# Patient Record
Sex: Male | Born: 1950 | Race: Black or African American | Hispanic: No | Marital: Married | Smoking: Never smoker
Health system: Southern US, Community
[De-identification: ages and names within clinical notes are randomized; demographics above are authoritative.]

## PROBLEM LIST (undated history)

## (undated) DIAGNOSIS — M1990 Unspecified osteoarthritis, unspecified site: Secondary | ICD-10-CM

## (undated) DIAGNOSIS — I10 Essential (primary) hypertension: Secondary | ICD-10-CM

## (undated) DIAGNOSIS — J45909 Unspecified asthma, uncomplicated: Secondary | ICD-10-CM

## (undated) DIAGNOSIS — E119 Type 2 diabetes mellitus without complications: Secondary | ICD-10-CM

## (undated) DIAGNOSIS — K219 Gastro-esophageal reflux disease without esophagitis: Secondary | ICD-10-CM

## (undated) HISTORY — DX: Type 2 diabetes mellitus without complications: E11.9

## (undated) HISTORY — DX: Essential (primary) hypertension: I10

---

## 2000-07-22 HISTORY — PX: COLONOSCOPY: SHX174

## 2001-05-05 ENCOUNTER — Ambulatory Visit (HOSPITAL_COMMUNITY): Admission: RE | Admit: 2001-05-05 | Discharge: 2001-05-05 | Payer: Self-pay | Admitting: Internal Medicine

## 2002-09-16 ENCOUNTER — Ambulatory Visit (HOSPITAL_COMMUNITY): Admission: RE | Admit: 2002-09-16 | Discharge: 2002-09-16 | Payer: Self-pay | Admitting: Internal Medicine

## 2002-09-16 ENCOUNTER — Encounter: Payer: Self-pay | Admitting: Internal Medicine

## 2006-04-23 ENCOUNTER — Ambulatory Visit: Payer: Self-pay | Admitting: Orthopedic Surgery

## 2006-08-11 ENCOUNTER — Ambulatory Visit: Payer: Self-pay | Admitting: Orthopedic Surgery

## 2006-08-18 ENCOUNTER — Ambulatory Visit (HOSPITAL_COMMUNITY): Admission: RE | Admit: 2006-08-18 | Discharge: 2006-08-18 | Payer: Self-pay | Admitting: Orthopedic Surgery

## 2006-09-29 ENCOUNTER — Ambulatory Visit: Payer: Self-pay | Admitting: Orthopedic Surgery

## 2008-11-15 DIAGNOSIS — Z8679 Personal history of other diseases of the circulatory system: Secondary | ICD-10-CM | POA: Insufficient documentation

## 2008-11-15 DIAGNOSIS — E119 Type 2 diabetes mellitus without complications: Secondary | ICD-10-CM | POA: Insufficient documentation

## 2008-11-17 ENCOUNTER — Ambulatory Visit: Payer: Self-pay | Admitting: Orthopedic Surgery

## 2008-11-17 DIAGNOSIS — IMO0002 Reserved for concepts with insufficient information to code with codable children: Secondary | ICD-10-CM | POA: Insufficient documentation

## 2008-11-17 DIAGNOSIS — M1710 Unilateral primary osteoarthritis, unspecified knee: Secondary | ICD-10-CM

## 2008-11-17 DIAGNOSIS — M25569 Pain in unspecified knee: Secondary | ICD-10-CM | POA: Insufficient documentation

## 2009-01-09 ENCOUNTER — Ambulatory Visit: Payer: Self-pay | Admitting: Cardiology

## 2009-01-10 ENCOUNTER — Ambulatory Visit (HOSPITAL_COMMUNITY): Admission: RE | Admit: 2009-01-10 | Discharge: 2009-01-10 | Payer: Self-pay | Admitting: Internal Medicine

## 2009-01-10 ENCOUNTER — Encounter (INDEPENDENT_AMBULATORY_CARE_PROVIDER_SITE_OTHER): Payer: Self-pay | Admitting: Internal Medicine

## 2009-03-01 ENCOUNTER — Encounter (INDEPENDENT_AMBULATORY_CARE_PROVIDER_SITE_OTHER): Payer: Self-pay | Admitting: *Deleted

## 2011-08-08 ENCOUNTER — Other Ambulatory Visit: Payer: Self-pay | Admitting: Urology

## 2011-12-27 ENCOUNTER — Ambulatory Visit (INDEPENDENT_AMBULATORY_CARE_PROVIDER_SITE_OTHER): Payer: BC Managed Care – PPO | Admitting: Urology

## 2011-12-27 DIAGNOSIS — N411 Chronic prostatitis: Secondary | ICD-10-CM

## 2011-12-27 DIAGNOSIS — N401 Enlarged prostate with lower urinary tract symptoms: Secondary | ICD-10-CM

## 2011-12-27 DIAGNOSIS — N138 Other obstructive and reflux uropathy: Secondary | ICD-10-CM

## 2011-12-27 DIAGNOSIS — N529 Male erectile dysfunction, unspecified: Secondary | ICD-10-CM

## 2012-04-17 ENCOUNTER — Ambulatory Visit (INDEPENDENT_AMBULATORY_CARE_PROVIDER_SITE_OTHER): Payer: BC Managed Care – PPO | Admitting: Urology

## 2012-04-17 DIAGNOSIS — N411 Chronic prostatitis: Secondary | ICD-10-CM

## 2012-04-17 DIAGNOSIS — N529 Male erectile dysfunction, unspecified: Secondary | ICD-10-CM

## 2012-04-17 DIAGNOSIS — N138 Other obstructive and reflux uropathy: Secondary | ICD-10-CM

## 2012-04-17 DIAGNOSIS — R972 Elevated prostate specific antigen [PSA]: Secondary | ICD-10-CM

## 2012-04-17 DIAGNOSIS — N401 Enlarged prostate with lower urinary tract symptoms: Secondary | ICD-10-CM

## 2012-07-17 ENCOUNTER — Ambulatory Visit (INDEPENDENT_AMBULATORY_CARE_PROVIDER_SITE_OTHER): Payer: BC Managed Care – PPO | Admitting: Urology

## 2012-07-17 DIAGNOSIS — N138 Other obstructive and reflux uropathy: Secondary | ICD-10-CM

## 2012-07-17 DIAGNOSIS — N411 Chronic prostatitis: Secondary | ICD-10-CM

## 2012-07-17 DIAGNOSIS — R972 Elevated prostate specific antigen [PSA]: Secondary | ICD-10-CM

## 2012-07-17 DIAGNOSIS — N401 Enlarged prostate with lower urinary tract symptoms: Secondary | ICD-10-CM

## 2012-07-17 DIAGNOSIS — N529 Male erectile dysfunction, unspecified: Secondary | ICD-10-CM

## 2012-11-30 ENCOUNTER — Other Ambulatory Visit (HOSPITAL_COMMUNITY): Payer: Self-pay | Admitting: Internal Medicine

## 2012-11-30 DIAGNOSIS — R109 Unspecified abdominal pain: Secondary | ICD-10-CM

## 2012-12-01 ENCOUNTER — Ambulatory Visit (HOSPITAL_COMMUNITY)
Admission: RE | Admit: 2012-12-01 | Discharge: 2012-12-01 | Disposition: A | Discharge status: Home / Self Care | Payer: BC Managed Care – PPO | Source: Ambulatory Visit | Attending: Internal Medicine | Admitting: Internal Medicine

## 2012-12-01 DIAGNOSIS — K769 Liver disease, unspecified: Secondary | ICD-10-CM | POA: Insufficient documentation

## 2012-12-01 DIAGNOSIS — R109 Unspecified abdominal pain: Secondary | ICD-10-CM | POA: Insufficient documentation

## 2013-01-01 ENCOUNTER — Ambulatory Visit (INDEPENDENT_AMBULATORY_CARE_PROVIDER_SITE_OTHER): Payer: BC Managed Care – PPO | Admitting: Urology

## 2013-01-01 DIAGNOSIS — N411 Chronic prostatitis: Secondary | ICD-10-CM

## 2013-01-01 DIAGNOSIS — N529 Male erectile dysfunction, unspecified: Secondary | ICD-10-CM

## 2013-01-15 ENCOUNTER — Ambulatory Visit: Payer: BC Managed Care – PPO | Admitting: Urology

## 2013-01-19 ENCOUNTER — Ambulatory Visit (INDEPENDENT_AMBULATORY_CARE_PROVIDER_SITE_OTHER): Payer: BC Managed Care – PPO | Admitting: Gastroenterology

## 2013-01-19 ENCOUNTER — Other Ambulatory Visit: Payer: Self-pay | Admitting: Internal Medicine

## 2013-01-19 ENCOUNTER — Encounter: Payer: Self-pay | Admitting: Gastroenterology

## 2013-01-19 VITALS — BP 132/69 | HR 72 | Temp 98.0°F | Ht 71.0 in | Wt 251.0 lb

## 2013-01-19 DIAGNOSIS — R1013 Epigastric pain: Secondary | ICD-10-CM

## 2013-01-19 DIAGNOSIS — R140 Abdominal distension (gaseous): Secondary | ICD-10-CM

## 2013-01-19 DIAGNOSIS — Z1211 Encounter for screening for malignant neoplasm of colon: Secondary | ICD-10-CM | POA: Insufficient documentation

## 2013-01-19 DIAGNOSIS — R6881 Early satiety: Secondary | ICD-10-CM

## 2013-01-19 DIAGNOSIS — R141 Gas pain: Secondary | ICD-10-CM

## 2013-01-19 MED ORDER — DEXLANSOPRAZOLE 60 MG PO CPDR: 60.0000 mg | DELAYED_RELEASE_CAPSULE | Freq: Every day | ORAL | Status: DC

## 2013-01-19 MED ORDER — PEG 3350-KCL-NA BICARB-NACL 420 G PO SOLR: 4000.0000 mL | ORAL | Status: DC

## 2013-01-19 NOTE — Assessment & Plan Note (Signed)
Overdue for screening colonoscopy. Patient would like to proceed at this time.  I have discussed the risks, alternatives, benefits with regards to but not limited to the risk of reaction to medication, bleeding, infection, perforation and the patient is agreeable to proceed. Written consent to be obtained.

## 2013-01-19 NOTE — Progress Notes (Signed)
Cc PCP 

## 2013-01-19 NOTE — Progress Notes (Signed)
Primary Care Physician:  FANTA,TESFAYE, MD  Primary Gastroenterologist:  Michael Rourk, MD   Chief Complaint  Patient presents with  . Abdominal Pain  . Diarrhea    HPI:  Benjamin Flynn is a 62 y.o. male here at the request of Dr. Fanta for further evaluation of abdominal pain. He had abdominal ultrasound last month which showed fatty liver but otherwise unremarkable. Gallbladder was unremarkable. Four to six weeks of abdominal bloating, rumbling, abdominal pain, early satiety. Feels like he is going to vomit when he eats. Has tried Pepto-Bismol and Gas-X, little benefit. Worse with meals.Typical pattern, one BM daily. For few weeks, may have extra BM. No frank diarrhea. No ill contacts, recent medication changes other than increase in insulin. No brbpr. Black stool with Pepto. No heartburn, indigestion. On Cipro for prostate issues, started after the symptoms started begun. Denies weight loss. States he had a colonoscopy remotely without any issues. Looks like was done by Dr. Rourk, records not available at this time. No prior upper endoscopy.  Current Outpatient Prescriptions  Medication Sig Dispense Refill  . amLODipine (NORVASC) 5 MG tablet Take 5 mg by mouth daily.      . ciprofloxacin (CIPRO) 500 MG tablet Take 500 mg by mouth 2 (two) times daily.      . insulin lispro protamine-lispro (HUMALOG 75/25) (75-25) 100 UNIT/ML SUSP Inject 26 Units into the skin 2 (two) times daily with a meal.      . losartan (COZAAR) 25 MG tablet Take 25 mg by mouth daily. Patient unsure of dose.      . metFORMIN (GLUCOPHAGE) 500 MG tablet Take 500 mg by mouth 2 (two) times daily with a meal.      . simethicone (MYLICON) 125 MG chewable tablet Chew 125 mg by mouth every 6 (six) hours as needed for flatulence.      . tadalafil (CIALIS) 5 MG tablet Take 5 mg by mouth daily as needed for erectile dysfunction.      .         No current facility-administered medications for this visit.    Allergies as of  01/19/2013  . (No Known Allergies)    Past Medical History  Diagnosis Date  . HTN (hypertension)   . Diabetes     Past Surgical History  Procedure Laterality Date  . Colonoscopy  2002    Family History  Problem Relation Age of Onset  . Liver cancer Father   . Colon cancer Neg Hx     History   Social History  . Marital Status: Single    Spouse Name: N/A    Number of Children: 2  . Years of Education: N/A   Occupational History  .     Social History Main Topics  . Smoking status: Never Smoker   . Smokeless tobacco: Not on file  . Alcohol Use: Yes     Comment: former user  . Drug Use: No  . Sexually Active: Not on file   Other Topics Concern  . Not on file   Social History Narrative  . No narrative on file      ROS:  General: Negative for anorexia, weight loss, fever, chills, fatigue, weakness. Eyes: Negative for vision changes.  ENT: Negative for hoarseness, difficulty swallowing , nasal congestion. CV: Negative for chest pain, angina, palpitations, dyspnea on exertion, peripheral edema.  Respiratory: Negative for dyspnea at rest, dyspnea on exertion, cough, sputum, wheezing.  GI: See history of present illness. GU:  Negative for   dysuria, hematuria, urinary incontinence, urinary frequency, nocturnal urination.  MS: Negative for joint pain, low back pain.  Derm: Negative for rash or itching.  Neuro: Negative for weakness, abnormal sensation, seizure, frequent headaches, memory loss, confusion.  Psych: Negative for anxiety, depression, suicidal ideation, hallucinations.  Endo: Negative for unusual weight change.  Heme: Negative for bruising or bleeding. Allergy: Negative for rash or hives.    Physical Examination:  BP 132/69  Pulse 72  Temp(Src) 98 F (36.7 C) (Oral)  Ht 5' 11" (1.803 m)  Wt 251 lb (113.853 kg)  BMI 35.02 kg/m2   General: Well-nourished, well-developed in no acute distress.  Head: Normocephalic, atraumatic.   Eyes: Conjunctiva  pink, no icterus. Mouth: Oropharyngeal mucosa moist and pink , no lesions erythema or exudate. Neck: Supple without thyromegaly, masses, or lymphadenopathy.  Lungs: Clear to auscultation bilaterally.  Heart: Regular rate and rhythm, no murmurs rubs or gallops.  Abdomen: Bowel sounds are normal, mild to moderate epigastric tenderness, nondistended, no hepatosplenomegaly or masses, no abdominal bruits or    hernia , no rebound or guarding.   Rectal: Deferred Extremities: No lower extremity edema. No clubbing or deformities.  Neuro: Alert and oriented x 4 , grossly normal neurologically.  Skin: Warm and dry, no rash or jaundice.   Psych: Alert and cooperative, normal mood and affect.   

## 2013-01-19 NOTE — Patient Instructions (Addendum)
1. Begin Dexilant 60mg  daily before breakfast. Prescription sent to your pharmacy.  2. We have scheduled you for a colonoscopy and upper endoscopy with Dr. Jena Gauss. Please see separate instructions.  3. Please have your blood work done.

## 2013-01-19 NOTE — Assessment & Plan Note (Signed)
62 year old gentleman with for six-week history of epigastric pain, early satiety, nausea, bloating. Abdominal ultrasound showed fatty liver but otherwise unremarkable. Denies heartburn symptoms. Symptoms may be secondary to diabetic gastroparesis however given new onset dyspepsia, EGD is advised.  I have discussed the risks, alternatives, benefits with regards to but not limited to the risk of reaction to medication, bleeding, infection, perforation and the patient is agreeable to proceed. Written consent to be obtained.  1. Dexilant 60mg  daily. 2. CMET, CBC, lipase. 3. EGD.

## 2013-01-27 ENCOUNTER — Encounter: Payer: Self-pay | Admitting: Gastroenterology

## 2013-01-27 ENCOUNTER — Encounter (HOSPITAL_COMMUNITY): Payer: Self-pay | Admitting: Pharmacy Technician

## 2013-02-09 LAB — CBC WITH DIFFERENTIAL/PLATELET
Hemoglobin: 13.7 g/dL (ref 13.0–17.0)
Lymphs Abs: 1.6 10*3/uL (ref 0.7–4.0)
MCH: 28.5 pg (ref 26.0–34.0)
Monocytes Relative: 9 % (ref 3–12)
Neutro Abs: 1.8 10*3/uL (ref 1.7–7.7)
Neutrophils Relative %: 47 % (ref 43–77)
RBC: 4.8 MIL/uL (ref 4.22–5.81)

## 2013-02-09 LAB — COMPREHENSIVE METABOLIC PANEL
ALT: 13 U/L (ref 0–53)
AST: 13 U/L (ref 0–37)
Alkaline Phosphatase: 66 U/L (ref 39–117)
BUN: 13 mg/dL (ref 6–23)
CO2: 29 mEq/L (ref 19–32)
Calcium: 9.4 mg/dL (ref 8.4–10.5)
Chloride: 107 mEq/L (ref 96–112)
Creat: 0.94 mg/dL (ref 0.50–1.35)
Potassium: 3.7 mEq/L (ref 3.5–5.3)
Sodium: 143 mEq/L (ref 135–145)
Total Bilirubin: 0.6 mg/dL (ref 0.3–1.2)

## 2013-02-10 ENCOUNTER — Encounter (HOSPITAL_COMMUNITY)
Admission: RE | Disposition: A | Discharge status: Home / Self Care | Payer: Self-pay | Source: Ambulatory Visit | Attending: Internal Medicine

## 2013-02-10 ENCOUNTER — Encounter (HOSPITAL_COMMUNITY): Payer: Self-pay

## 2013-02-10 ENCOUNTER — Ambulatory Visit (HOSPITAL_COMMUNITY)
Admission: RE | Admit: 2013-02-10 | Discharge: 2013-02-10 | Disposition: A | Discharge status: Home / Self Care | Payer: BC Managed Care – PPO | Source: Ambulatory Visit | Attending: Internal Medicine | Admitting: Internal Medicine

## 2013-02-10 DIAGNOSIS — E119 Type 2 diabetes mellitus without complications: Secondary | ICD-10-CM | POA: Insufficient documentation

## 2013-02-10 DIAGNOSIS — K449 Diaphragmatic hernia without obstruction or gangrene: Secondary | ICD-10-CM | POA: Insufficient documentation

## 2013-02-10 DIAGNOSIS — Z1211 Encounter for screening for malignant neoplasm of colon: Secondary | ICD-10-CM

## 2013-02-10 DIAGNOSIS — K2940 Chronic atrophic gastritis without bleeding: Secondary | ICD-10-CM | POA: Insufficient documentation

## 2013-02-10 DIAGNOSIS — R933 Abnormal findings on diagnostic imaging of other parts of digestive tract: Secondary | ICD-10-CM

## 2013-02-10 DIAGNOSIS — Z01812 Encounter for preprocedural laboratory examination: Secondary | ICD-10-CM | POA: Insufficient documentation

## 2013-02-10 DIAGNOSIS — R6881 Early satiety: Secondary | ICD-10-CM

## 2013-02-10 DIAGNOSIS — R1013 Epigastric pain: Secondary | ICD-10-CM

## 2013-02-10 DIAGNOSIS — K3189 Other diseases of stomach and duodenum: Secondary | ICD-10-CM | POA: Insufficient documentation

## 2013-02-10 DIAGNOSIS — I10 Essential (primary) hypertension: Secondary | ICD-10-CM | POA: Insufficient documentation

## 2013-02-10 DIAGNOSIS — D126 Benign neoplasm of colon, unspecified: Secondary | ICD-10-CM

## 2013-02-10 DIAGNOSIS — R140 Abdominal distension (gaseous): Secondary | ICD-10-CM

## 2013-02-10 DIAGNOSIS — A048 Other specified bacterial intestinal infections: Secondary | ICD-10-CM | POA: Insufficient documentation

## 2013-02-10 DIAGNOSIS — Z794 Long term (current) use of insulin: Secondary | ICD-10-CM | POA: Insufficient documentation

## 2013-02-10 HISTORY — DX: Gastro-esophageal reflux disease without esophagitis: K21.9

## 2013-02-10 HISTORY — DX: Unspecified osteoarthritis, unspecified site: M19.90

## 2013-02-10 HISTORY — PX: COLONOSCOPY WITH ESOPHAGOGASTRODUODENOSCOPY (EGD): SHX5779

## 2013-02-10 LAB — GLUCOSE, CAPILLARY

## 2013-02-10 SURGERY — COLONOSCOPY WITH ESOPHAGOGASTRODUODENOSCOPY (EGD)
Anesthesia: Moderate Sedation

## 2013-02-10 MED ORDER — BUTAMBEN-TETRACAINE-BENZOCAINE 2-2-14 % EX AERO
INHALATION_SPRAY | CUTANEOUS | Status: DC | PRN
  Administered 2013-02-10: 2 via TOPICAL

## 2013-02-10 MED ORDER — ONDANSETRON HCL 4 MG/2ML IJ SOLN
INTRAMUSCULAR | Status: AC
  Filled 2013-02-10: qty 2

## 2013-02-10 MED ORDER — MEPERIDINE HCL 100 MG/ML IJ SOLN
INTRAMUSCULAR | Status: AC
  Filled 2013-02-10: qty 1

## 2013-02-10 MED ORDER — MIDAZOLAM HCL 5 MG/5ML IJ SOLN
INTRAMUSCULAR | Status: DC | PRN
  Administered 2013-02-10 (×2): 1 mg via INTRAVENOUS
  Administered 2013-02-10: 2 mg via INTRAVENOUS
  Administered 2013-02-10: 1 mg via INTRAVENOUS
  Administered 2013-02-10: 2 mg via INTRAVENOUS
  Administered 2013-02-10: 1 mg via INTRAVENOUS

## 2013-02-10 MED ORDER — MEPERIDINE HCL 100 MG/ML IJ SOLN
INTRAMUSCULAR | Status: DC | PRN
  Administered 2013-02-10: 25 mg via INTRAVENOUS
  Administered 2013-02-10: 50 mg via INTRAVENOUS
  Administered 2013-02-10: 25 mg via INTRAVENOUS

## 2013-02-10 MED ORDER — STERILE WATER FOR IRRIGATION IR SOLN
Status: DC | PRN
  Administered 2013-02-10: 09:00:00

## 2013-02-10 MED ORDER — MIDAZOLAM HCL 5 MG/5ML IJ SOLN
INTRAMUSCULAR | Status: AC
  Filled 2013-02-10: qty 10

## 2013-02-10 MED ORDER — SODIUM CHLORIDE 0.9 % IV SOLN
INTRAVENOUS | Status: DC
  Administered 2013-02-10: 08:00:00 via INTRAVENOUS

## 2013-02-10 MED ORDER — ONDANSETRON HCL 4 MG/2ML IJ SOLN
INTRAMUSCULAR | Status: DC | PRN
  Administered 2013-02-10: 4 mg via INTRAVENOUS

## 2013-02-10 NOTE — H&P (View-Only) (Signed)
Primary Care Physician:  Avon Gully, MD  Primary Gastroenterologist:  Roetta Sessions, MD   Chief Complaint  Patient presents with  . Abdominal Pain  . Diarrhea    HPI:  Benjamin Flynn is a 62 y.o. male here at the request of Dr. Felecia Shelling for further evaluation of abdominal pain. He had abdominal ultrasound last month which showed fatty liver but otherwise unremarkable. Gallbladder was unremarkable. Four to six weeks of abdominal bloating, rumbling, abdominal pain, early satiety. Feels like he is going to vomit when he eats. Has tried Pepto-Bismol and Gas-X, little benefit. Worse with meals.Typical pattern, one BM daily. For few weeks, may have extra BM. No frank diarrhea. No ill contacts, recent medication changes other than increase in insulin. No brbpr. Black stool with Pepto. No heartburn, indigestion. On Cipro for prostate issues, started after the symptoms started begun. Denies weight loss. States he had a colonoscopy remotely without any issues. Looks like was done by Dr. Jena Gauss, records not available at this time. No prior upper endoscopy.  Current Outpatient Prescriptions  Medication Sig Dispense Refill  . amLODipine (NORVASC) 5 MG tablet Take 5 mg by mouth daily.      . ciprofloxacin (CIPRO) 500 MG tablet Take 500 mg by mouth 2 (two) times daily.      . insulin lispro protamine-lispro (HUMALOG 75/25) (75-25) 100 UNIT/ML SUSP Inject 26 Units into the skin 2 (two) times daily with a meal.      . losartan (COZAAR) 25 MG tablet Take 25 mg by mouth daily. Patient unsure of dose.      . metFORMIN (GLUCOPHAGE) 500 MG tablet Take 500 mg by mouth 2 (two) times daily with a meal.      . simethicone (MYLICON) 125 MG chewable tablet Chew 125 mg by mouth every 6 (six) hours as needed for flatulence.      . tadalafil (CIALIS) 5 MG tablet Take 5 mg by mouth daily as needed for erectile dysfunction.      .         No current facility-administered medications for this visit.    Allergies as of  01/19/2013  . (No Known Allergies)    Past Medical History  Diagnosis Date  . HTN (hypertension)   . Diabetes     Past Surgical History  Procedure Laterality Date  . Colonoscopy  2002    Family History  Problem Relation Age of Onset  . Liver cancer Father   . Colon cancer Neg Hx     History   Social History  . Marital Status: Single    Spouse Name: N/A    Number of Children: 2  . Years of Education: N/A   Occupational History  .     Social History Main Topics  . Smoking status: Never Smoker   . Smokeless tobacco: Not on file  . Alcohol Use: Yes     Comment: former user  . Drug Use: No  . Sexually Active: Not on file   Other Topics Concern  . Not on file   Social History Narrative  . No narrative on file      ROS:  General: Negative for anorexia, weight loss, fever, chills, fatigue, weakness. Eyes: Negative for vision changes.  ENT: Negative for hoarseness, difficulty swallowing , nasal congestion. CV: Negative for chest pain, angina, palpitations, dyspnea on exertion, peripheral edema.  Respiratory: Negative for dyspnea at rest, dyspnea on exertion, cough, sputum, wheezing.  GI: See history of present illness. GU:  Negative for  dysuria, hematuria, urinary incontinence, urinary frequency, nocturnal urination.  MS: Negative for joint pain, low back pain.  Derm: Negative for rash or itching.  Neuro: Negative for weakness, abnormal sensation, seizure, frequent headaches, memory loss, confusion.  Psych: Negative for anxiety, depression, suicidal ideation, hallucinations.  Endo: Negative for unusual weight change.  Heme: Negative for bruising or bleeding. Allergy: Negative for rash or hives.    Physical Examination:  BP 132/69  Pulse 72  Temp(Src) 98 F (36.7 C) (Oral)  Ht 5\' 11"  (1.803 m)  Wt 251 lb (113.853 kg)  BMI 35.02 kg/m2   General: Well-nourished, well-developed in no acute distress.  Head: Normocephalic, atraumatic.   Eyes: Conjunctiva  pink, no icterus. Mouth: Oropharyngeal mucosa moist and pink , no lesions erythema or exudate. Neck: Supple without thyromegaly, masses, or lymphadenopathy.  Lungs: Clear to auscultation bilaterally.  Heart: Regular rate and rhythm, no murmurs rubs or gallops.  Abdomen: Bowel sounds are normal, mild to moderate epigastric tenderness, nondistended, no hepatosplenomegaly or masses, no abdominal bruits or    hernia , no rebound or guarding.   Rectal: Deferred Extremities: No lower extremity edema. No clubbing or deformities.  Neuro: Alert and oriented x 4 , grossly normal neurologically.  Skin: Warm and dry, no rash or jaundice.   Psych: Alert and cooperative, normal mood and affect.

## 2013-02-10 NOTE — Op Note (Signed)
[facility] [address] [place], [address]   COLONOSCOPY PROCEDURE REPORT  PATIENT: Benjamin Flynn, Benjamin Flynn  MR#:         [id number] BIRTHDATE: 08-16-1950 , 62  yrs. old GENDER: Male ENDOSCOPIST: R.  Roetta Sessions, MD FACP FACG REFERRED BY:  Glenice Laine, M.D. PROCEDURE DATE:  02/10/2013 PROCEDURE:      colonoscopy with snare polypectomy  INDICATIONS: average risk colorectal cancer screening  INFORMED CONSENT:  The risks, benefits, alternatives and imponderables including but not limited to bleeding, perforation as well as the possibility of a missed lesion have been reviewed.  The potential for biopsy, lesion removal, etc. have also been discussed.  Questions have been answered.  All parties agreeable. Please see the history and physical in the medical record for more information.  MEDICATIONS: Versed 6 mg of Demerol 100 mg IV in divided doses. Cetacaine spray. Zofran 4 mg  DESCRIPTION OF PROCEDURE:  After a digital rectal exam was performed, the EG-2990i ([id number]) and EC-3890Li ([id number]) colonoscope was advanced from the anus through the rectum and colon to the area of the cecum, ileocecal valve and appendiceal orifice. The cecum was deeply intubated.  These structures were well-seen and photographed for the record.  From the level of the cecum and ileocecal valve, the scope was slowly and cautiously withdrawn. The mucosal surfaces were carefully surveyed utilizing scope tip deflection to facilitate fold flattening as needed.  The scope was pulled down into the rectum where a thorough examination including retroflexion was performed.    FINDINGS:  adequate preparation. Normal rectum.  (1) 8 mm sessile polyp in the base of the cecum; otherwise, the remainder of colonic mucosa appeared normal.  THERAPEUTIC / DIAGNOSTIC MANEUVERS PERFORMED:  The above-mentioned polyp was hot snare removed.  COMPLICATIONS: none  CECAL WITHDRAWAL TIME:  10  minutes  IMPRESSION:  Colonic polyp-removed as described above  RECOMMENDATIONS: Followup on pathology. See EGD report.   _______________________________ eSigned:  R. Roetta Sessions, MD FACP [facility] 02/10/2013 9:48 AM   CC:

## 2013-02-10 NOTE — Progress Notes (Signed)
Quick Note:  Labs ok. EGD as planned. ______

## 2013-02-10 NOTE — Interval H&P Note (Signed)
History and Physical Interval Note:  02/10/2013 9:00 AM  Benjamin Flynn  has presented today for surgery, with the diagnosis of EARLY SATIETY, EPIGASTRIC PAIN, BLOATING, SCREENING COLONOSCOPY  The various methods of treatment have been discussed with the patient and family. After consideration of risks, benefits and other options for treatment, the patient has consented to  Procedure(s) with comments: COLONOSCOPY WITH ESOPHAGOGASTRODUODENOSCOPY (EGD) (N/A) - 8:30 as a surgical intervention .  The patient's history has been reviewed, patient examined, no change in status, stable for surgery.  I have reviewed the patient's chart and labs.  Questions were answered to the patient's satisfaction.     Benjamin Flynn  Little improvement with Dexilant. EGD and colonoscopy per plan.The risks, benefits, limitations, imponderables and alternatives regarding both EGD and colonoscopy have been reviewed with the patient. Questions have been answered. All parties agreeable.

## 2013-02-10 NOTE — Op Note (Signed)
[facility] [address] [place], [address]   ENDOSCOPY PROCEDURE REPORT  PATIENT: Benjamin, Flynn  MR#: [id number] BIRTHDATE: 1950/11/15 , 62  yrs. old GENDER: Male ENDOSCOPIST: R.  Roetta Sessions, MD FACP FACG REFERRED BY:  Glenice Laine, M.D. PROCEDURE DATE:  02/10/2013 PROCEDURE:     EGD with gastric biopsy  INDICATIONS:     Early satiety/dyspepsia  INFORMED CONSENT:   The risks, benefits, limitations, alternatives and imponderables have been discussed.  The potential for biopsy, esophogeal dilation, etc. have also been reviewed.  Questions have been answered.  All parties agreeable.  Please see the history and physical in the medical record for more information.  MEDICATIONS:  Versed 6 mg IV and Demerol 100 mg IV in divided doses. Zofran 4 mg IV. Cetacaine spray.  DESCRIPTION OF PROCEDURE:   The EG-2990i ([id number]) and EC-3890Li ([id number])  endoscope was introduced through the mouth and advanced to the second portion of the duodenum without difficulty or limitations.  The mucosal surfaces were surveyed very carefully during advancement of the scope and upon withdrawal.  Retroflexion view of the proximal stomach and esophagogastric junction was performed.      FINDINGS:  Normal esophagus. Stomach empty. Small hiatal hernia. Slightly reticulated-appearing gastric mucosa-diffusely. No ulcer or infiltrating process. Patent pylorus. Normal first and second portion of the duodenum  THERAPEUTIC / DIAGNOSTIC MANEUVERS PERFORMED:  Biopsies of the abnormal gastric mucosa taken for histology   COMPLICATIONS:  None  IMPRESSION:    Small hiatal hernia. Minimally abnormal-appearing gastric mucosa-status post biopsy.  RECOMMENDATIONS:  Followup on pathology. See colonoscopy report    _______________________________ R. Roetta Sessions, MD FACP [facility] eSigned:  R. Roetta Sessions, MD FACP [facility] 02/10/2013 9:30 AM     CC:

## 2013-02-12 ENCOUNTER — Encounter: Payer: Self-pay | Admitting: Internal Medicine

## 2013-02-15 ENCOUNTER — Encounter (HOSPITAL_COMMUNITY): Payer: Self-pay | Admitting: Internal Medicine

## 2013-02-15 ENCOUNTER — Telehealth: Payer: Self-pay

## 2013-02-15 NOTE — Telephone Encounter (Signed)
Letter from: Corbin Ade   Reason for Letter: Results Review   Send letter to patient.  Send copy of letter with path to referring provider and PCP.  Raynelle Fanning' pt needs prevpak or generic equivalent x 14 days ; no refills; offer ov w extender in about 6 weeks         Letters

## 2013-02-15 NOTE — Telephone Encounter (Signed)
Tried to call pt- LMOM 

## 2013-02-16 NOTE — Telephone Encounter (Signed)
Tried to call pt- LMOM 

## 2013-02-18 MED ORDER — AMOXICILL-CLARITHRO-LANSOPRAZ PO MISC: Freq: Two times a day (BID) | ORAL | Status: DC

## 2013-02-18 NOTE — Telephone Encounter (Signed)
Pt aware, rx sent to pharmacy. 

## 2013-03-16 ENCOUNTER — Telehealth: Payer: Self-pay

## 2013-03-16 NOTE — Telephone Encounter (Signed)
agree

## 2013-03-16 NOTE — Telephone Encounter (Signed)
Pt came by office- he is taking prevpac (holding dexilant per our instructions) and having some increased heartburn. He brought a bottle of "[facility] gas relief" and asked if it was ok to take it with the prevpac for burning. Advised him that it was for gas. Spoke with LSL- she said it was ok to take zantac 150mg  bid while taking prevpac. Pt is aware.

## 2013-03-30 ENCOUNTER — Encounter: Payer: Self-pay | Admitting: Internal Medicine

## 2013-04-01 ENCOUNTER — Telehealth: Payer: Self-pay | Admitting: *Deleted

## 2013-04-01 ENCOUNTER — Ambulatory Visit: Payer: BC Managed Care – PPO | Admitting: Gastroenterology

## 2013-04-01 NOTE — Telephone Encounter (Signed)
Pt cancelled appt. Pt stated he got called into work and will call back in the morning to reschedule.

## 2013-04-01 NOTE — Telephone Encounter (Signed)
Noted  

## 2013-04-06 ENCOUNTER — Telehealth: Payer: Self-pay | Admitting: *Deleted

## 2013-04-06 ENCOUNTER — Other Ambulatory Visit: Payer: Self-pay

## 2013-04-06 DIAGNOSIS — R197 Diarrhea, unspecified: Secondary | ICD-10-CM

## 2013-04-06 NOTE — Telephone Encounter (Signed)
Tried to call pt- LMOM with details. Asked him to go to lab today and get container. Lab order faxed to lab.

## 2013-04-06 NOTE — Telephone Encounter (Signed)
Called pt- he is not taking prevpac as directed. He stated he still has 6-7 days left on his rx. He stopped taking it 2 days ago and has had diarrhea 2-3x a day ever since. He continues to take liquid antacids and pepto and hes not sure what is going on. He had to cancel his last appt because of work and wants another appt but is asking for something called in to stop the diarrhea. Please advise.

## 2013-04-06 NOTE — Telephone Encounter (Signed)
Needs to complete the abx. Has he tried imodium? Likely antibiotic-associated diarrhea. Check Cdiff PCR for completeness.

## 2013-04-06 NOTE — Telephone Encounter (Signed)
Pt called stating he is having problems with his stomach and diarrhea for 2 days, pt would like something to take for it, pt wanted to make appt. I told him next open date would be in oct. Pt states he can't wait that long. Please advise [phone]

## 2013-04-12 ENCOUNTER — Telehealth: Payer: Self-pay | Admitting: Internal Medicine

## 2013-04-12 NOTE — Telephone Encounter (Signed)
Pt called wanting to be seen today by some body. Pt has been noncompliant and insisting on OV. I offered OV in next available, but he refused. Still waiting for patient to turn in his stool sample to [facility] Pt said he has been vomiting with diarrhea all weekend. He has cancelled several OV already. Please advise

## 2013-04-12 NOTE — Telephone Encounter (Signed)
Agree 

## 2013-04-12 NOTE — Telephone Encounter (Signed)
Spoke with pt- he still has 3 days worth of hpylori medications. I informed pt that it was only a 14 day supply of medicine and it was called in to the pharmacy in July and he should have finished it before now. He picked up the stool container Thursday and has not turned it in yet. Asked him to turn it in today. He is wanting someone to take him out of work, pt had ov on 04/01/13 and cancelled, Darl Pikes offered him next available office visit and he refused to take it. Asked pt to turn in stool sample asap so we can get results back. Also asked him if he cannot wait for appointment he should go to pcp or ED. Pt verbalized understanding.

## 2013-04-14 LAB — CLOSTRIDIUM DIFFICILE BY PCR: Toxigenic C. Difficile by PCR: NOT DETECTED

## 2013-04-15 NOTE — Progress Notes (Signed)
Quick Note:  Cdiff negative. Hopefully, with completion of abx, he will note improvement in symptoms. If not, contact us. ______

## 2013-04-16 ENCOUNTER — Ambulatory Visit (INDEPENDENT_AMBULATORY_CARE_PROVIDER_SITE_OTHER): Payer: BC Managed Care – PPO | Admitting: Urology

## 2013-04-16 DIAGNOSIS — N529 Male erectile dysfunction, unspecified: Secondary | ICD-10-CM

## 2013-04-16 DIAGNOSIS — N401 Enlarged prostate with lower urinary tract symptoms: Secondary | ICD-10-CM

## 2013-04-16 DIAGNOSIS — N138 Other obstructive and reflux uropathy: Secondary | ICD-10-CM

## 2013-04-16 DIAGNOSIS — N411 Chronic prostatitis: Secondary | ICD-10-CM

## 2013-05-03 ENCOUNTER — Ambulatory Visit: Payer: BC Managed Care – PPO | Admitting: Gastroenterology

## 2013-05-03 ENCOUNTER — Telehealth: Payer: Self-pay | Admitting: *Deleted

## 2013-05-03 NOTE — Telephone Encounter (Signed)
Tried to call patient with no answer  

## 2013-05-03 NOTE — Telephone Encounter (Signed)
Pt called stating he would like for julie to call him in the morning, pt states he is having intergestion Please advise [phone]

## 2013-05-05 NOTE — Telephone Encounter (Signed)
Tried to call pt- NA 

## 2013-05-12 NOTE — Telephone Encounter (Signed)
Tried to call pt- NA 

## 2013-05-20 ENCOUNTER — Encounter: Payer: Self-pay | Admitting: Gastroenterology

## 2013-05-20 ENCOUNTER — Ambulatory Visit (INDEPENDENT_AMBULATORY_CARE_PROVIDER_SITE_OTHER): Payer: BC Managed Care – PPO | Admitting: Gastroenterology

## 2013-05-20 VITALS — BP 132/73 | HR 77 | Temp 97.1°F | Wt 236.6 lb

## 2013-05-20 DIAGNOSIS — D369 Benign neoplasm, unspecified site: Secondary | ICD-10-CM | POA: Insufficient documentation

## 2013-05-20 DIAGNOSIS — R6881 Early satiety: Secondary | ICD-10-CM

## 2013-05-20 DIAGNOSIS — A048 Other specified bacterial intestinal infections: Secondary | ICD-10-CM

## 2013-05-20 DIAGNOSIS — B9681 Helicobacter pylori [H. pylori] as the cause of diseases classified elsewhere: Secondary | ICD-10-CM | POA: Insufficient documentation

## 2013-05-20 MED ORDER — ONDANSETRON 4 MG PO TBDP: 4.0000 mg | ORAL_TABLET | Freq: Three times a day (TID) | ORAL | Status: DC | PRN

## 2013-05-20 NOTE — Assessment & Plan Note (Signed)
Check stool antigen in near future. Consider rechecking at follow-up in 6 weeks. However, needs further work-up for persistent nausea and weight loss. GES recommended and will be set up after patient calls [place]. Regardless, return in 6 weeks.

## 2013-05-20 NOTE — Assessment & Plan Note (Signed)
With associated weight loss of close to 20 lbs, unintentional, since July 2014. Intermittent nausea, rare vomiting. Worsened with greasy foods. EGD on file with H.pylori gastritis, s/p Prevpac treatment. With history of diabetes, concern for diabetic gastroparesis, less likely occult malignancy. Doubt biliary etiology.  Recommend GES: patient states he will call us to set this up after checking work schedule Increase Prilosec to BID X 14 days, then back to once daily Provide gastroparesis diet Return in 6 weeks  Zofran prn for nausea

## 2013-05-20 NOTE — Assessment & Plan Note (Signed)
Surveillance in 2019.  

## 2013-05-20 NOTE — Progress Notes (Signed)
Referring Provider: Avon Gully, MD Primary Care Physician:  Avon Gully, MD Primary GI: Dr. Jena Gauss   Chief Complaint  Patient presents with  . Diarrhea  . Emesis    HPI:   Benjamin Flynn presents today in follow-up after colonoscopy and EGD. EGD showed small hiatal hernia and +H.pylori gastritis. Colonoscopy with tubular adenoma. Prevpac provided in July 2014, but it appears he did not take this until September. Called in with diarrhea, Cdiff PCR negative.  Diarrhea resolved with cessation of antibiotics. Greasy foods causes N/V. Sometimes lower abdominal discomfort with greasy foods. Feels like has to have a bowel movement but can't. BM every day, back to his baseline. Feels full off of small amounts. Weighed 251 in July 2014. Now 236. Hx of diabetes. Can eat [place] bacon normally, but ate some on Sunday and became nauseated with vomiting. Can't eat like he used to. Yesterday had grilled tenderloin sandwich, hashbrowns, coke and was fine. Rare vomiting. Intermittent nausea. Phenergan knocked him out.   Past Medical History  Diagnosis Date  . HTN (hypertension)   . Diabetes   . GERD (gastroesophageal reflux disease)   . Arthritis     Past Surgical History  Procedure Laterality Date  . Colonoscopy  2002    Dr. Jena Gauss. Normal.  . Colonoscopy with esophagogastroduodenoscopy (egd) N/A 02/10/2013    WUJ:[id number] hiatal hernia. Minimally abnormal-appearing gastric mucosa-status post biopsy. PATH H. PYLORI GASTRITIS:Colonic polyp-TUBULAR ADENOMA    Current Outpatient Prescriptions  Medication Sig Dispense Refill  . amLODipine (NORVASC) 5 MG tablet Take 5 mg by mouth daily.      . insulin lispro protamine-lispro (HUMALOG 75/25) (75-25) 100 UNIT/ML SUSP Inject 26 Units into the skin 2 (two) times daily with a meal.      . losartan (COZAAR) 25 MG tablet Take 25 mg by mouth daily. Patient unsure of dose.      . metFORMIN (GLUCOPHAGE) 500 MG tablet Take 500 mg by mouth 2 (two) times daily with a  meal.      . omeprazole (PRILOSEC) 20 MG capsule Take 20 mg by mouth daily.      . promethazine (PHENERGAN) 25 MG tablet Take 25 mg by mouth every 6 (six) hours as needed for nausea.      . simethicone (MYLICON) 125 MG chewable tablet Chew 125 mg by mouth every 6 (six) hours as needed for flatulence.      . tadalafil (CIALIS) 5 MG tablet Take 5 mg by mouth daily as needed for erectile dysfunction.      . ondansetron (ZOFRAN ODT) 4 MG disintegrating tablet Take 1 tablet (4 mg total) by mouth every 8 (eight) hours as needed for nausea.  30 tablet  3   No current facility-administered medications for this visit.    Allergies as of 05/20/2013  . (No Known Allergies)    Family History  Problem Relation Age of Onset  . Liver cancer Father   . Colon cancer Neg Hx     History   Social History  . Marital Status: Married    Spouse Name: N/A    Number of Children: 2  . Years of Education: N/A   Occupational History  .     Social History Main Topics  . Smoking status: Never Smoker   . Smokeless tobacco: None  . Alcohol Use: No     Comment: former user  . Drug Use: No  . Sexual Activity: Yes    Birth Control/ Protection: None   Other Topics  Concern  . None   Social History Narrative  . None    Review of Systems: As mentioned in HPI.   Physical Exam: BP 132/73  Pulse 77  Temp(Src) 97.1 F (36.2 C) (Oral)  Wt 236 lb 9.6 oz (107.321 kg)  BMI 33.01 kg/m2 General:   Alert and oriented. No distress noted. Pleasant and cooperative.  Head:  Normocephalic and atraumatic. Eyes:  Conjuctiva clear without scleral icterus. Mouth:  Oral mucosa pink and moist. Good dentition. No lesions. Abdomen:  +BS, soft, non-tender and non-distended. No rebound or guarding. No HSM or masses noted. Msk:  Symmetrical without gross deformities. Normal posture. Extremities:  Without edema. Neurologic:  Alert and  oriented x4;  grossly normal neurologically.  Lab Results  Component Value Date    WBC 3.8* 02/08/2013   HGB 13.7 02/08/2013   HCT 41.0 02/08/2013   MCV 85.4 02/08/2013   PLT 231 02/08/2013   Lab Results  Component Value Date   ALT 13 02/08/2013   AST 13 02/08/2013   ALKPHOS 66 02/08/2013   BILITOT 0.6 02/08/2013

## 2013-05-20 NOTE — Patient Instructions (Signed)
We have provided samples of Prilosec to take in the evening, 30 minutes before dinner. KEEP TAKING the Prilosec you have each morning, 30 minutes for breakfast. This will mean you are taking two doses a day for 14 days, then back to just once a day.   I have sent a prescription for Zofran, a nausea medication, to your pharmacy.   I would like to set you up for a gastric emptying study; please call when you have a good date for this.   We will see you back in about 6 weeks.

## 2013-05-24 NOTE — Progress Notes (Signed)
cc'd to pcp 

## 2013-05-31 ENCOUNTER — Other Ambulatory Visit: Payer: Self-pay | Admitting: Gastroenterology

## 2013-05-31 DIAGNOSIS — R112 Nausea with vomiting, unspecified: Secondary | ICD-10-CM

## 2013-06-03 ENCOUNTER — Encounter (HOSPITAL_COMMUNITY): Payer: Self-pay

## 2013-06-03 ENCOUNTER — Encounter (HOSPITAL_COMMUNITY)
Admission: RE | Admit: 2013-06-03 | Discharge: 2013-06-03 | Disposition: A | Discharge status: Home / Self Care | Payer: BC Managed Care – PPO | Source: Ambulatory Visit | Attending: Gastroenterology | Admitting: Gastroenterology

## 2013-06-03 DIAGNOSIS — R112 Nausea with vomiting, unspecified: Secondary | ICD-10-CM

## 2013-06-03 HISTORY — DX: Unspecified asthma, uncomplicated: J45.909

## 2013-06-03 MED ORDER — TECHNETIUM TC 99M SULFUR COLLOID
2.0000 | Freq: Once | INTRAVENOUS | Status: AC | PRN
  Administered 2013-06-03: 2 via ORAL

## 2013-06-07 NOTE — Progress Notes (Signed)
Quick Note:  GES is normal.  How is he on PPI BID and prn Zofran? I will be seeing him in December. ______

## 2013-07-06 ENCOUNTER — Ambulatory Visit: Payer: BC Managed Care – PPO | Admitting: Gastroenterology

## 2013-08-03 ENCOUNTER — Telehealth: Payer: Self-pay | Admitting: Gastroenterology

## 2013-08-03 ENCOUNTER — Ambulatory Visit: Payer: BC Managed Care – PPO | Admitting: Gastroenterology

## 2013-08-03 NOTE — Telephone Encounter (Signed)
Please send letter. We have had a difficult time getting up with him.

## 2013-08-03 NOTE — Telephone Encounter (Signed)
Pt was a no show

## 2013-08-06 NOTE — Telephone Encounter (Signed)
Pt has OV on 2/10 at 10 with LSL

## 2013-08-31 ENCOUNTER — Encounter: Payer: Self-pay | Admitting: Gastroenterology

## 2013-08-31 ENCOUNTER — Ambulatory Visit (INDEPENDENT_AMBULATORY_CARE_PROVIDER_SITE_OTHER): Payer: BC Managed Care – PPO | Admitting: Gastroenterology

## 2013-08-31 VITALS — BP 138/66 | HR 78 | Temp 97.6°F | Ht 71.0 in | Wt 235.6 lb

## 2013-08-31 DIAGNOSIS — K2940 Chronic atrophic gastritis without bleeding: Secondary | ICD-10-CM

## 2013-08-31 DIAGNOSIS — K219 Gastro-esophageal reflux disease without esophagitis: Secondary | ICD-10-CM

## 2013-08-31 DIAGNOSIS — B9681 Helicobacter pylori [H. pylori] as the cause of diseases classified elsewhere: Secondary | ICD-10-CM

## 2013-08-31 DIAGNOSIS — K2970 Gastritis, unspecified, without bleeding: Principal | ICD-10-CM

## 2013-08-31 DIAGNOSIS — A048 Other specified bacterial intestinal infections: Secondary | ICD-10-CM

## 2013-08-31 NOTE — Assessment & Plan Note (Signed)
He continues to have some early satiety and food intolerances. Given that he had issues taking Prevpac, we need to check H. pylori stool antigen in the near future when he is been off a PPI for 2 weeks. Explained this at length with patient he voices understanding. In the meantime he will call let us know what PPI Dr. Legrand Rams placed him on. Discussed antireflux measures, gastritis diet. If H. pylori stool antigen is negative and he continues to have problems, consider HIDA scan to rule out biliary dyskinesia.

## 2013-08-31 NOTE — Progress Notes (Signed)
Primary Care Physician: Rosita Fire, MD  Primary Gastroenterologist:  Garfield Cornea, MD   Chief Complaint  Patient presents with  . Follow-up    HPI: Benjamin Flynn is a 63 y.o. male here for followup. He no showed his January 13 appointment. He was last in October 2014. He has history of H. pylori gastritis on EGD last year. He was given Prevpac in July but he did not take it until September. He reports that he was unable to complete therapy due to excessive vomiting while in the medication. He believes he took about 5 or 6 days worth. He was noted to have a 15 pound weight loss at time of his last office visit. His weight is stable at this time. Gastric emptying study was normal in November.  Patient reports that he's been doing much better. He notes that he has to completely avoid foods that are terrible for him such as greasy, fatty foods or he develops abdominal discomfort and nausea. Will have bad heartburn if eats greasy foods. I am "paying for eating a hot dog last night". BM regular. No melena rectal bleeding. Cannot eat much at a time, eats frequently instead.   Not sure what is taking for his acid reflux. States Dr. Legrand Rams gave him a different medication.  Gallbladder ultrasound unremarkable last May.  Current Outpatient Prescriptions  Medication Sig Dispense Refill  . amLODipine (NORVASC) 5 MG tablet Take 5 mg by mouth daily.      Marland Kitchen FARXIGA 10 MG TABS Take 10 mg by mouth daily.       . insulin lispro protamine-lispro (HUMALOG 75/25) (75-25) 100 UNIT/ML SUSP Inject 20 Units into the skin 2 (two) times daily with a meal.       . losartan (COZAAR) 25 MG tablet Take 25 mg by mouth daily. Patient unsure of dose.      . metFORMIN (GLUCOPHAGE) 500 MG tablet Take 500 mg by mouth 2 (two) times daily with a meal.      . omeprazole (PRILOSEC) 20 MG capsule Take 20 mg by mouth daily.      . ondansetron (ZOFRAN ODT) 4 MG disintegrating tablet Take 1 tablet (4 mg total) by mouth every  8 (eight) hours as needed for nausea.  30 tablet  3  . promethazine (PHENERGAN) 25 MG tablet Take 25 mg by mouth every 6 (six) hours as needed for nausea.      . simethicone (MYLICON) 161 MG chewable tablet Chew 125 mg by mouth every 6 (six) hours as needed for flatulence.      . tadalafil (CIALIS) 5 MG tablet Take 5 mg by mouth daily as needed for erectile dysfunction.       No current facility-administered medications for this visit.    Allergies as of 08/31/2013  . (No Known Allergies)    ROS:  General: Negative for anorexia, weight loss, fever, chills, fatigue, weakness. ENT: Negative for hoarseness, difficulty swallowing , nasal congestion. CV: Negative for chest pain, angina, palpitations, dyspnea on exertion, peripheral edema.  Respiratory: Negative for dyspnea at rest, dyspnea on exertion, cough, sputum, wheezing.  GI: See history of present illness. GU:  Negative for dysuria, hematuria, urinary incontinence, urinary frequency, nocturnal urination.  Endo: Negative for unusual weight change.    Physical Examination:   BP 138/66  Pulse 78  Temp(Src) 97.6 F (36.4 C) (Oral)  Ht 5\' 11"  (1.803 m)  Wt 235 lb 9.6 oz (106.867 kg)  BMI 32.87 kg/m2  General:  Well-nourished, well-developed in no acute distress.  Eyes: No icterus. Mouth: Oropharyngeal mucosa moist and pink , no lesions erythema or exudate. Lungs: Clear to auscultation bilaterally.  Heart: Regular rate and rhythm, no murmurs rubs or gallops.  Abdomen: Bowel sounds are normal, nontender, nondistended, no hepatosplenomegaly or masses, no abdominal bruits or hernia , no rebound or guarding.   Extremities: No lower extremity edema. No clubbing or deformities. Neuro: Alert and oriented x 4   Skin: Warm and dry, no jaundice.   Psych: Alert and cooperative, normal mood and affect.

## 2013-08-31 NOTE — Patient Instructions (Signed)
1. Please call us and let us know what you were taking for your acid reflux. 2. You'll need to collect a stool specimen to make sure H. pylori was successfully treated. You have to be off of your acid reflux medication for at least 2 weeks before collecting your stool specimen. You also can not have any antibiotics during this time frame either.    Gastritis, Adult Gastritis is soreness and swelling (inflammation) of the lining of the stomach. Gastritis can develop as a sudden onset (acute) or long-term (chronic) condition. If gastritis is not treated, it can lead to stomach bleeding and ulcers. CAUSES  Gastritis occurs when the stomach lining is weak or damaged. Digestive juices from the stomach then inflame the weakened stomach lining. The stomach lining may be weak or damaged due to viral or bacterial infections. One common bacterial infection is the Helicobacter pylori infection. Gastritis can also result from excessive alcohol consumption, taking certain medicines, or having too much acid in the stomach.  SYMPTOMS  In some cases, there are no symptoms. When symptoms are present, they may include:  Pain or a burning sensation in the upper abdomen.  Nausea.  Vomiting.  An uncomfortable feeling of fullness after eating. DIAGNOSIS  Your caregiver may suspect you have gastritis based on your symptoms and a physical exam. To determine the cause of your gastritis, your caregiver may perform the following:  Blood or stool tests to check for the H pylori bacterium.  Gastroscopy. A thin, flexible tube (endoscope) is passed down the esophagus and into the stomach. The endoscope has a light and camera on the end. Your caregiver uses the endoscope to view the inside of the stomach.  Taking a tissue sample (biopsy) from the stomach to examine under a microscope. TREATMENT  Depending on the cause of your gastritis, medicines may be prescribed. If you have a bacterial infection, such as an H pylori  infection, antibiotics may be given. If your gastritis is caused by too much acid in the stomach, H2 blockers or antacids may be given. Your caregiver may recommend that you stop taking aspirin, ibuprofen, or other nonsteroidal anti-inflammatory drugs (NSAIDs). HOME CARE INSTRUCTIONS  Only take over-the-counter or prescription medicines as directed by your caregiver.  If you were given antibiotic medicines, take them as directed. Finish them even if you start to feel better.  Drink enough fluids to keep your urine clear or pale yellow.  Avoid foods and drinks that make your symptoms worse, such as:  Caffeine or alcoholic drinks.  Chocolate.  Peppermint or mint flavorings.  Garlic and onions.  Spicy foods.  Citrus fruits, such as oranges, lemons, or limes.  Tomato-based foods such as sauce, chili, salsa, and pizza.  Fried and fatty foods.  Eat small, frequent meals instead of large meals. SEEK IMMEDIATE MEDICAL CARE IF:   You have black or dark red stools.  You vomit blood or material that looks like coffee grounds.  You are unable to keep fluids down.  Your abdominal pain gets worse.  You have a fever.  You do not feel better after 1 week.  You have any other questions or concerns. MAKE SURE YOU:  Understand these instructions.  Will watch your condition.  Will get help right away if you are not doing well or get worse. Document Released: 07/02/2001 Document Revised: 01/07/2012 Document Reviewed: 08/21/2011 [facility] Patient Information 2014 [place].

## 2013-08-31 NOTE — Progress Notes (Signed)
cc'd to pcp 

## 2013-10-28 ENCOUNTER — Telehealth: Payer: Self-pay | Admitting: Gastroenterology

## 2013-10-28 NOTE — Telephone Encounter (Signed)
Please f/u with patient.   1. He never had H.Pylori stool Ag done. He has to be off PPI for two weeks prior to stool collection and then can resume. 2. Please find out which PPI his PPI put him on. He said it changed at last OV.

## 2013-10-28 NOTE — Telephone Encounter (Signed)
LMOM to call back

## 2013-11-10 NOTE — Telephone Encounter (Signed)
Mailed out letter today.

## 2013-11-17 LAB — HELICOBACTER PYLORI  SPECIAL ANTIGEN: H. PYLORI ANTIGEN STOOL: POSITIVE

## 2013-11-25 ENCOUNTER — Other Ambulatory Visit: Payer: Self-pay | Admitting: Gastroenterology

## 2013-11-25 ENCOUNTER — Encounter: Payer: Self-pay | Admitting: Gastroenterology

## 2013-11-25 MED ORDER — OMEPRAZOLE 20 MG PO CPDR: 20.0000 mg | DELAYED_RELEASE_CAPSULE | Freq: Two times a day (BID) | ORAL | Status: DC

## 2013-11-25 MED ORDER — BIS SUBCIT-METRONID-TETRACYC 140-125-125 MG PO CAPS: 3.0000 | ORAL_CAPSULE | Freq: Three times a day (TID) | ORAL | Status: DC

## 2013-11-25 NOTE — Progress Notes (Signed)
Quick Note:  Pt came by the office for results and was informed. He will start the Pylera and take Omeprazole bid while taking the Pylera. ( It is Omeprazole, he has bottle in hand).  He will call if problems. Routing to [place] to schedule next office visit. ______

## 2013-11-25 NOTE — Progress Notes (Signed)
Quick Note:  H.Pylori was not successfully treated last year. Patient only took 5 days of therapy due to vomiting. I sent in RX for Pylera. He MUST take all of it. He needs to call us if he has intolerance or side effects so we can help manage.  He needs to take omeprazole 20mg  BID while on Pylera. Please reiterate this. I'm not sure which PPI he is currently on b/c he was not sure at [place]. Let's check with patient or pharmacy records. Only needs to take one PPI BID while on therapy. Resume PPI daily afterwards.  OV in 8 weeks. ______

## 2014-01-24 ENCOUNTER — Ambulatory Visit: Payer: BC Managed Care – PPO | Admitting: Gastroenterology

## 2014-01-24 ENCOUNTER — Telehealth: Payer: Self-pay | Admitting: Gastroenterology

## 2014-01-24 NOTE — Telephone Encounter (Signed)
Pt was a no show

## 2014-01-25 ENCOUNTER — Telehealth: Payer: Self-pay | Admitting: Internal Medicine

## 2014-01-25 NOTE — Telephone Encounter (Signed)
Pt was a no show for his OV yesterday and comes in today thinking he was going to be seen. I explained to him that his OV was for yesterday and he said that the letter he got said July 7th.  I printed the letter off to show him but he would not look at it and I told him that I wanted him to see it to know that the OV was for July 6 at 1030 with LSL.  He said to [facility] him and to make it at 10 or 1030.  I offered him OV with LSL for 8/18 and he got angry even more and said NO THAT AINT GOING TO WORK.  I looked for something on AS schedule and luckily had a cancellation for 7/20 at 1030 and he agreed to take that. Patient was VERY hostile.

## 2014-01-31 NOTE — Telephone Encounter (Signed)
Reviewed

## 2014-01-31 NOTE — Telephone Encounter (Signed)
Encounter noted

## 2014-02-07 ENCOUNTER — Encounter: Payer: Self-pay | Admitting: Gastroenterology

## 2014-02-07 ENCOUNTER — Ambulatory Visit (INDEPENDENT_AMBULATORY_CARE_PROVIDER_SITE_OTHER): Payer: BC Managed Care – PPO | Admitting: Gastroenterology

## 2014-02-07 ENCOUNTER — Encounter (INDEPENDENT_AMBULATORY_CARE_PROVIDER_SITE_OTHER): Payer: Self-pay

## 2014-02-07 VITALS — BP 125/71 | HR 65 | Temp 97.9°F | Resp 20 | Ht 71.0 in | Wt 232.0 lb

## 2014-02-07 DIAGNOSIS — B9681 Helicobacter pylori [H. pylori] as the cause of diseases classified elsewhere: Secondary | ICD-10-CM

## 2014-02-07 DIAGNOSIS — K2970 Gastritis, unspecified, without bleeding: Principal | ICD-10-CM

## 2014-02-07 DIAGNOSIS — A048 Other specified bacterial intestinal infections: Secondary | ICD-10-CM

## 2014-02-07 DIAGNOSIS — K2940 Chronic atrophic gastritis without bleeding: Secondary | ICD-10-CM

## 2014-02-07 MED ORDER — SUCRALFATE 1 GM/10ML PO SUSP: 1.0000 g | Freq: Four times a day (QID) | ORAL | Status: DC

## 2014-02-07 MED ORDER — ONDANSETRON 4 MG PO TBDP: 4.0000 mg | ORAL_TABLET | Freq: Three times a day (TID) | ORAL | Status: DC | PRN

## 2014-02-07 NOTE — Patient Instructions (Signed)
Stop Prilosec for now. Start taking the Nexium samples, 2 capsules each morning 30 minutes before breakfast.   I have sent a short course of Carafate to your pharmacy to take for 5-7 days.  Zofran for nausea has been sent to the pharmacy.  Stop taking your reflux medication on Aug 1st. On Aug 14th, do the breath test at the lab. After you are done with the breath test, restart your reflux medication. If you like Nexium, let us know so we can send in a prescription.  We will see you back in 6 weeks. We may need to do further testing to see if your gallbladder is causing some symptoms. Avoid fatty, greasy foods.

## 2014-02-07 NOTE — Progress Notes (Signed)
Referring Provider: Rosita Fire, MD Primary Care Physician:  Rosita Fire, MD Primary GI: Dr. Gala Romney  Chief Complaint  Patient presents with  . Follow-up    HPI:   Benjamin Flynn returns today in routine follow-up. History oF H.pylori gastritis with inadequate treatment with Prevpac. Unable to tolerate Prevpac. Stool antigen revealed persistent H.pylori. Prescribed Pylera. Finished Pylera. Ate a burrito last Tuesday. Stool was a little loose but better. Some mild abdominal discomfort upper abdomen. A little nausea, some mild upper abdominal discomfort with greasy food.   Past Medical History  Diagnosis Date  . HTN (hypertension)   . Diabetes   . GERD (gastroesophageal reflux disease)   . Arthritis   . Asthma     Past Surgical History  Procedure Laterality Date  . Colonoscopy  2002    Dr. Gala Romney. Normal.  . Colonoscopy with esophagogastroduodenoscopy (egd) N/A 02/10/2013    HBZ:JIRCV hiatal hernia. Minimally abnormal-appearing gastric mucosa-status post biopsy. PATH H. PYLORI GASTRITIS:Colonic polyp-TUBULAR ADENOMA    Current Outpatient Prescriptions  Medication Sig Dispense Refill  . amLODipine (NORVASC) 5 MG tablet Take 5 mg by mouth daily.      Marland Kitchen bismuth-metronidazole-tetracycline (PYLERA) 140-125-125 MG per capsule Take 3 capsules by mouth 4 (four) times daily -  before meals and at bedtime.  120 capsule  0  . FARXIGA 10 MG TABS Take 10 mg by mouth daily.       . insulin lispro protamine-lispro (HUMALOG 75/25) (75-25) 100 UNIT/ML SUSP Inject 20 Units into the skin 2 (two) times daily with a meal.       . losartan (COZAAR) 25 MG tablet Take 25 mg by mouth daily. Patient unsure of dose.      . metFORMIN (GLUCOPHAGE) 500 MG tablet Take 500 mg by mouth 2 (two) times daily with a meal.      . omeprazole (PRILOSEC) 20 MG capsule Take 1 capsule (20 mg total) by mouth 2 (two) times daily before a meal.  20 capsule  0  . ondansetron (ZOFRAN ODT) 4 MG disintegrating tablet Take  1 tablet (4 mg total) by mouth every 8 (eight) hours as needed for nausea.  30 tablet  1  . promethazine (PHENERGAN) 25 MG tablet Take 25 mg by mouth every 6 (six) hours as needed for nausea.      . simethicone (MYLICON) 893 MG chewable tablet Chew 125 mg by mouth every 6 (six) hours as needed for flatulence.      . tadalafil (CIALIS) 5 MG tablet Take 5 mg by mouth daily as needed for erectile dysfunction.      . sucralfate (CARAFATE) 1 GM/10ML suspension Take 10 mLs (1 g total) by mouth 4 (four) times daily.  420 mL  1   No current facility-administered medications for this visit.    Allergies as of 02/07/2014  . (No Known Allergies)    Family History  Problem Relation Age of Onset  . Liver cancer Father   . Colon cancer Neg Hx     History   Social History  . Marital Status: Married    Spouse Name: N/A    Number of Children: 2  . Years of Education: N/A   Occupational History  .     Social History Main Topics  . Smoking status: Never Smoker   . Smokeless tobacco: None  . Alcohol Use: No     Comment: former user  . Drug Use: No  . Sexual Activity: Yes  Birth Control/ Protection: None   Other Topics Concern  . None   Social History Narrative  . None    Review of Systems: As mentioned in HPI.   Physical Exam: BP 125/71  Pulse 65  Temp(Src) 97.9 F (36.6 C) (Oral)  Resp 20  Ht 5\' 11"  (1.803 m)  Wt 232 lb (105.235 kg)  BMI 32.37 kg/m2 General:   Alert and oriented. No distress noted. Pleasant and cooperative.  Head:  Normocephalic and atraumatic. Eyes:  Conjuctiva clear without scleral icterus. Mouth:  Oral mucosa pink and moist. Good dentition. No lesions. Abdomen:  +BS, soft, mild TTP epigastric region and non-distended. No rebound or guarding. No HSM or masses noted. Msk:  Symmetrical without gross deformities. Normal posture. Extremities:  Without edema. Neurologic:  Alert and  oriented x4;  grossly normal neurologically. Skin:  Intact without  significant lesions or rashes. Psych:  Alert and cooperative. Normal mood and affect.  May 2014 US abdomen: fatty liver Nov 2014 GES: normal..

## 2014-02-09 NOTE — Assessment & Plan Note (Signed)
63 year old male with history of H.pylori gastritis, failing original treatment with Prevpac and recently completing Pylera. Now with recurrent epigastric discomfort after eating a large burrito. Question gastritis as culprit. Will need to obtain urea breath test for documentation of eradication; however, he will need to be off a PPI for 2 weeks prior. At this point, I have counseled him to remain on the PPI until the beginning of August, then hold X 2 weeks for test. Trial of Nexium. Carafate short-term. Avoid fatty/greasy foods. Gallbladder remains in situ, with [place] of abdomen on file from May 2014. Consider HIDA if no improvement after change in PPI and documentation of H.pylori eradication.

## 2014-02-15 NOTE — Progress Notes (Signed)
cc'd to pcp 

## 2014-03-08 ENCOUNTER — Ambulatory Visit: Payer: BC Managed Care – PPO | Admitting: Gastroenterology

## 2014-03-11 LAB — H. PYLORI BREATH TEST: H. pylori Breath Test: NOT DETECTED

## 2014-03-14 ENCOUNTER — Telehealth: Payer: Self-pay | Admitting: Internal Medicine

## 2014-03-14 NOTE — Telephone Encounter (Signed)
Patient needs refill on Nexium uses [facility] in [place], also inquiring about test results.

## 2014-03-14 NOTE — Telephone Encounter (Signed)
Routing to refill box for refill and to AS for test results.

## 2014-03-16 MED ORDER — ESOMEPRAZOLE MAGNESIUM 40 MG PO CPDR: 40.0000 mg | DELAYED_RELEASE_CAPSULE | ORAL | Status: DC

## 2014-03-16 NOTE — Progress Notes (Signed)
Quick Note:  Negative H.pylori breath test.  Resume PPI if not already done so. ______

## 2014-03-16 NOTE — Addendum Note (Signed)
Addended by: Orvil Feil on: 03/16/2014 01:31 PM   Modules accepted: Orders

## 2014-03-16 NOTE — Telephone Encounter (Signed)
Refill completed. See result note.

## 2014-03-21 ENCOUNTER — Telehealth: Payer: Self-pay | Admitting: Gastroenterology

## 2014-03-21 ENCOUNTER — Ambulatory Visit: Payer: BC Managed Care – PPO | Admitting: Gastroenterology

## 2014-03-21 NOTE — Telephone Encounter (Signed)
Pt was a no show

## 2014-03-21 NOTE — Telephone Encounter (Signed)
Please send letter.

## 2014-03-22 ENCOUNTER — Encounter: Payer: Self-pay | Admitting: Gastroenterology

## 2014-03-22 NOTE — Telephone Encounter (Signed)
Mailed letter °

## 2014-03-30 ENCOUNTER — Telehealth: Payer: Self-pay

## 2014-03-30 MED ORDER — PANTOPRAZOLE SODIUM 40 MG PO TBEC: 40.0000 mg | DELAYED_RELEASE_TABLET | Freq: Every day | ORAL | Status: DC

## 2014-03-30 NOTE — Telephone Encounter (Signed)
pantoprazole RX completed.

## 2014-03-30 NOTE — Telephone Encounter (Signed)
Tried to call pt- NA. LMOM with instructions about new medication.

## 2014-03-30 NOTE — Telephone Encounter (Signed)
Tried to do a PA for nexium for this pt. Received a fax stated pt must try and fail : omeprazole,lansoprazole, pantoprazole and rabeprazole. Pt has only tried and failed omeprazole and dexilant. Pt will have to try all the other generics prior to nexium (even the generic) will be covered. Routing to the refill box.

## 2014-04-06 ENCOUNTER — Other Ambulatory Visit: Payer: Self-pay

## 2014-04-07 MED ORDER — SUCRALFATE 1 GM/10ML PO SUSP: 1.0000 g | Freq: Four times a day (QID) | ORAL | Status: DC

## 2014-04-22 ENCOUNTER — Ambulatory Visit: Payer: BC Managed Care – PPO | Admitting: Urology

## 2014-04-28 ENCOUNTER — Ambulatory Visit (INDEPENDENT_AMBULATORY_CARE_PROVIDER_SITE_OTHER): Payer: BC Managed Care – PPO | Admitting: Gastroenterology

## 2014-04-28 ENCOUNTER — Encounter (INDEPENDENT_AMBULATORY_CARE_PROVIDER_SITE_OTHER): Payer: Self-pay

## 2014-04-28 ENCOUNTER — Encounter: Payer: Self-pay | Admitting: Gastroenterology

## 2014-04-28 VITALS — BP 120/68 | HR 73 | Temp 98.2°F | Ht 71.0 in | Wt 233.4 lb

## 2014-04-28 DIAGNOSIS — K219 Gastro-esophageal reflux disease without esophagitis: Secondary | ICD-10-CM

## 2014-04-28 DIAGNOSIS — B9681 Helicobacter pylori [H. pylori] as the cause of diseases classified elsewhere: Secondary | ICD-10-CM

## 2014-04-28 DIAGNOSIS — K2970 Gastritis, unspecified, without bleeding: Principal | ICD-10-CM

## 2014-04-28 MED ORDER — LANSOPRAZOLE 30 MG PO CPDR: 30.0000 mg | DELAYED_RELEASE_CAPSULE | Freq: Every day | ORAL | Status: DC

## 2014-04-28 NOTE — Patient Instructions (Signed)
Stop Protonix. Start taking Prevacid once each morning, 30 minutes before breakfast. Let me know if this works for you or not.  We will see you back in 1 year!

## 2014-04-28 NOTE — Progress Notes (Signed)
Referring Provider: Rosita Fire, MD Primary Care Physician:  Rosita Fire, MD Primary GI: Dr. Gala Romney   Chief Complaint  Patient presents with  . Follow-up    HPI:   Benjamin Flynn presents today with a history of H.pylori gastritis with Prevpac failure but subsequently completely Pylera. Urea breath test negative. Successful eradication. Gallbladder remains in situ.   If eats anything greasy has to run to bathroom. Now eating a lot of grilled and baked foods. Last year was 250, now 18. Purposeful. Stomach gets full then stops eating. Dietary changes purposeful. No N/V. No dysphagia. Really likes Nexium better. Has tried/failed multiple PPIs.     Past Medical History  Diagnosis Date  . HTN (hypertension)   . Diabetes   . GERD (gastroesophageal reflux disease)   . Arthritis   . Asthma     Past Surgical History  Procedure Laterality Date  . Colonoscopy  2002    Dr. Gala Romney. Normal.  . Colonoscopy with esophagogastroduodenoscopy (egd) N/A 02/10/2013    XTG:GYIRS hiatal hernia. Minimally abnormal-appearing gastric mucosa-status post biopsy. PATH H. PYLORI GASTRITIS:Colonic polyp-TUBULAR ADENOMA    Current Outpatient Prescriptions  Medication Sig Dispense Refill  . amLODipine (NORVASC) 5 MG tablet Take 5 mg by mouth daily.      Marland Kitchen bismuth-metronidazole-tetracycline (PYLERA) 140-125-125 MG per capsule Take 3 capsules by mouth 4 (four) times daily -  before meals and at bedtime.  120 capsule  0  . FARXIGA 10 MG TABS Take 10 mg by mouth daily.       . insulin lispro protamine-lispro (HUMALOG 75/25) (75-25) 100 UNIT/ML SUSP Inject 20 Units into the skin 2 (two) times daily with a meal.       . losartan (COZAAR) 25 MG tablet Take 25 mg by mouth daily. Patient unsure of dose.      . metFORMIN (GLUCOPHAGE) 500 MG tablet Take 500 mg by mouth 2 (two) times daily with a meal.      . ondansetron (ZOFRAN ODT) 4 MG disintegrating tablet Take 1 tablet (4 mg total) by mouth every 8 (eight)  hours as needed for nausea.  30 tablet  1  . pantoprazole (PROTONIX) 40 MG tablet Take 1 tablet (40 mg total) by mouth daily.  90 tablet  3  . promethazine (PHENERGAN) 25 MG tablet Take 25 mg by mouth every 6 (six) hours as needed for nausea.      . simethicone (MYLICON) 854 MG chewable tablet Chew 125 mg by mouth every 6 (six) hours as needed for flatulence.      . sucralfate (CARAFATE) 1 GM/10ML suspension Take 10 mLs (1 g total) by mouth 4 (four) times daily.  420 mL  1  . tadalafil (CIALIS) 5 MG tablet Take 5 mg by mouth daily as needed for erectile dysfunction.       No current facility-administered medications for this visit.    Allergies as of 04/28/2014  . (No Known Allergies)    Family History  Problem Relation Age of Onset  . Liver cancer Father   . Colon cancer Neg Hx     History   Social History  . Marital Status: Married    Spouse Name: N/A    Number of Children: 2  . Years of Education: N/A   Occupational History  .     Social History Main Topics  . Smoking status: Never Smoker   . Smokeless tobacco: None  . Alcohol Use: No     Comment: former user  .  Drug Use: No  . Sexual Activity: Yes    Birth Control/ Protection: None   Other Topics Concern  . None   Social History Narrative  . None    Review of Systems: As mentioned in HPI.   Physical Exam: BP 120/68  Pulse 73  Temp(Src) 98.2 F (36.8 C) (Oral)  Ht 5\' 11"  (1.803 m)  Wt 233 lb 6.4 oz (105.87 kg)  BMI 32.57 kg/m2 General:   Alert and oriented. No distress noted. Pleasant and cooperative.  Head:  Normocephalic and atraumatic. Eyes:  Conjuctiva clear without scleral icterus. Mouth:  Oral mucosa pink and moist. Good dentition. No lesions. Abdomen:  +BS, soft, non-tender and non-distended. No rebound or guarding. No HSM or masses noted. Msk:  Symmetrical without gross deformities. Normal posture. Extremities:  Without edema. Neurologic:  Alert and  oriented x4;  grossly normal  neurologically. Skin:  Intact without significant lesions or rashes. Psych:  Alert and cooperative. Normal mood and affect.

## 2014-04-29 NOTE — Assessment & Plan Note (Signed)
Needs to fail Prevacid prior to starting Nexium per insurance requirements. Start Prevacid. If does well, continue Prevacid. If needed, will call in Nexium. Otherwise, return in 1 year.

## 2014-04-29 NOTE — Assessment & Plan Note (Signed)
Failure with Prevpac originally, then treated with Pylera. Eradication documented with negative urea breath test.

## 2014-05-02 NOTE — Progress Notes (Signed)
cc'ed to pcp °

## 2014-06-07 ENCOUNTER — Other Ambulatory Visit: Payer: Self-pay

## 2014-06-07 NOTE — Telephone Encounter (Signed)
Vicente Males, you recently saw this patient. Do you think he still needs Carafate? Looks like he has been on chronically.

## 2014-06-13 ENCOUNTER — Other Ambulatory Visit: Payer: Self-pay

## 2014-06-13 MED ORDER — SUCRALFATE 1 GM/10ML PO SUSP: 1.0000 g | Freq: Four times a day (QID) | ORAL | Status: DC

## 2014-07-06 NOTE — Telephone Encounter (Signed)
Carafate was sent 11/.23.

## 2014-11-08 ENCOUNTER — Telehealth: Payer: Self-pay | Admitting: Internal Medicine

## 2014-11-08 MED ORDER — SUCRALFATE 1 GM/10ML PO SUSP: 1.0000 g | Freq: Four times a day (QID) | ORAL | Status: DC

## 2014-11-08 MED ORDER — LANSOPRAZOLE 30 MG PO CPDR: 30.0000 mg | DELAYED_RELEASE_CAPSULE | Freq: Every day | ORAL | Status: DC

## 2014-11-08 NOTE — Telephone Encounter (Signed)
PATIENT IS TAKING PANTOPROAZOLE FOR REFLUX AND IT IS NOT DOING ANY GOOD.  CAN SOMETHING ELSE BE CALLED IN FOR HIM.  PLEASE ADVISE [phone]

## 2014-11-08 NOTE — Addendum Note (Signed)
Addended by: Mahala Menghini on: 11/08/2014 01:06 PM   Modules accepted: Orders, Medications

## 2014-11-08 NOTE — Telephone Encounter (Signed)
done

## 2014-11-08 NOTE — Telephone Encounter (Signed)
Spoke with the pt. He was taking lansoprazole, which was given to him at his last ov and has been doing well. He needs rx sent to [facility] in [place]. He is also requesting a refill of carafate.

## 2014-11-15 ENCOUNTER — Other Ambulatory Visit: Payer: Self-pay

## 2014-11-16 MED ORDER — SUCRALFATE 1 GM/10ML PO SUSP: 1.0000 g | Freq: Four times a day (QID) | ORAL | Status: DC

## 2015-02-20 ENCOUNTER — Telehealth: Payer: Self-pay | Admitting: Internal Medicine

## 2015-02-20 MED ORDER — ESOMEPRAZOLE MAGNESIUM 40 MG PO CPDR: 40.0000 mg | DELAYED_RELEASE_CAPSULE | Freq: Every day | ORAL | Status: DC

## 2015-02-20 NOTE — Telephone Encounter (Signed)
Stop prevacid. Sending in Nexium. He has been on Nexium historically but had to fail others before having approved again. Will try to approve Nexium.   Gallbladder remains. Would offer ultrasound abdomen possibly if no improvement. Offer non-urgent OV due to change in status.

## 2015-02-20 NOTE — Telephone Encounter (Signed)
Tried to call pt- NA- LMOM 

## 2015-02-20 NOTE — Addendum Note (Signed)
Addended by: Orvil Feil on: 02/20/2015 01:14 PM   Modules accepted: Orders

## 2015-02-20 NOTE — Telephone Encounter (Signed)
Pt called back- he has been feeling bad all weekend. He is feeling a little better now but is still having some abd pain. He is taking lansoprazole daily, but he also took pepto and zantac and it didn't make him feel any better. He also took some carafate this morning. Some nausea on Friday but none now. bm's are ok, no blood in his stool.   He wants to know if there is anything else he can do to help?

## 2015-02-20 NOTE — Telephone Encounter (Signed)
PATIENT CALLED STATING THAT HE IS VERY SICK  PLEASE CALL [phone]

## 2015-02-20 NOTE — Telephone Encounter (Signed)
Tried to call pt- Benjamin Flynn with instructions. Asked him to call me back.

## 2015-02-21 NOTE — Telephone Encounter (Signed)
Pt is aware and PA has been done and faxed to the insurance co. Gave samples of otc nexium. #10

## 2015-02-28 NOTE — Telephone Encounter (Signed)
Pt called- he continues to have problems with is stomach. He is hurting as before.   Magda Paganini, please see Anna's note below. Is it ok to order U/S?   Erline Levine, please schedule ov.

## 2015-03-01 ENCOUNTER — Encounter: Payer: Self-pay | Admitting: Internal Medicine

## 2015-03-01 NOTE — Telephone Encounter (Signed)
He needs to be evaluated before ordering tests. He was last seen in 04/2014. Let's make sure he is taking Nexium 40mg  daily.  Offer urgent OV. If symptoms worsen, he should go to PCP or ER for severe abdominal pain, vomiting, fever, blood in stool.

## 2015-03-01 NOTE — Telephone Encounter (Signed)
Put pt on the schedule for Monday at 11:30 with AS. I tried to call him- NA, I left the appt date and time. Asked him to call if he could not keep this appt.

## 2015-03-01 NOTE — Telephone Encounter (Signed)
KEPT HIS September APPOINTMENT DATE AS WELL IN CASE HE DOES NOT KEEP Monday VISIT

## 2015-03-01 NOTE — Telephone Encounter (Signed)
APPOINTMENT MADE AND LETTER SENT °

## 2015-03-06 ENCOUNTER — Ambulatory Visit (INDEPENDENT_AMBULATORY_CARE_PROVIDER_SITE_OTHER): Payer: BLUE CROSS/BLUE SHIELD | Admitting: Gastroenterology

## 2015-03-06 VITALS — BP 136/78 | HR 73 | Temp 97.5°F | Wt 245.8 lb

## 2015-03-06 DIAGNOSIS — R1013 Epigastric pain: Secondary | ICD-10-CM | POA: Diagnosis not present

## 2015-03-06 MED ORDER — RABEPRAZOLE SODIUM 20 MG PO TBEC: 20.0000 mg | DELAYED_RELEASE_TABLET | Freq: Every day | ORAL | Status: DC

## 2015-03-06 NOTE — Patient Instructions (Signed)
We have ordered an ultrasound of your gallbladder. You may need further testing.   I have provided samples of Nexium. However, in order for your insurance to consider covering Nexium, you will need to trial generic Aciphex. I sent this to your pharmacy, only a month supply. Try this, then we can resubmit to your insurance.

## 2015-03-06 NOTE — Progress Notes (Signed)
Referring Provider: Rosita Fire, MD Primary Care Physician:  Rosita Fire, MD  Primary GI: Dr. Gala Romney   Chief Complaint  Patient presents with  . Abdominal Pain    HPI:   Benjamin Flynn is a 64 y.o. male presenting today with a history of H.pylori gastritis with Prevpac failure but subsequently completing Pylera. Urea breath test negative. Successful eradication. Gallbladder remains in situ. Last seen Oct 2015. Called into our office early August due to abdominal pain.   Acute onset of diffuse abdominal pain, lasting 5-10 minutes, mild nausea. Lost appetite. Resolved on own. States since this episode has decreased appetite. Avoiding greasy foods. Trying to eat healthy. Ate a [place] sandwich prior to episode. Has intermittent pain if eating any greasy food. Associated diarrhea postprandially. No further episodes of pain but does have some bloating.   Past Medical History  Diagnosis Date  . HTN (hypertension)   . Diabetes   . GERD (gastroesophageal reflux disease)   . Arthritis   . Asthma     Past Surgical History  Procedure Laterality Date  . Colonoscopy  2002    Dr. Gala Romney. Normal.  . Colonoscopy with esophagogastroduodenoscopy (egd) N/A 02/10/2013    AJG:OTLXB hiatal hernia. Minimally abnormal-appearing gastric mucosa-status post biopsy. PATH H. PYLORI GASTRITIS:Colonic polyp-TUBULAR ADENOMA    Current Outpatient Prescriptions  Medication Sig Dispense Refill  . amLODipine (NORVASC) 5 MG tablet Take 5 mg by mouth daily.    Marland Kitchen esomeprazole (NEXIUM) 40 MG capsule Take 1 capsule (40 mg total) by mouth daily at 12 noon. 30 capsule 5  . insulin lispro protamine-lispro (HUMALOG 75/25) (75-25) 100 UNIT/ML SUSP Inject 30 Units into the skin 2 (two) times daily with a meal.     . losartan (COZAAR) 25 MG tablet Take 25 mg by mouth daily. Patient unsure of dose.    . metFORMIN (GLUCOPHAGE) 500 MG tablet Take 500 mg by mouth 2 (two) times daily with a meal.    . sucralfate (CARAFATE) 1  GM/10ML suspension Take 10 mLs (1 g total) by mouth 4 (four) times daily. 420 mL 1  . tadalafil (CIALIS) 5 MG tablet Take 5 mg by mouth daily as needed for erectile dysfunction.    . RABEprazole (ACIPHEX) 20 MG tablet Take 1 tablet (20 mg total) by mouth daily. 30 minutes before breakfast. 30 tablet 0   No current facility-administered medications for this visit.    Allergies as of 03/06/2015  . (No Known Allergies)    Family History  Problem Relation Age of Onset  . Liver cancer Father   . Colon cancer Neg Hx     Social History   Social History  . Marital Status: Married    Spouse Name: N/A  . Number of Children: 2  . Years of Education: N/A   Occupational History  .     Social History Main Topics  . Smoking status: Never Smoker   . Smokeless tobacco: Not on file  . Alcohol Use: No     Comment: former user  . Drug Use: No  . Sexual Activity: Yes    Birth Control/ Protection: None   Other Topics Concern  . Not on file   Social History Narrative  . No narrative on file    Review of Systems: As mentioned in HPI  Physical Exam: BP 136/78 mmHg  Pulse 73  Temp(Src) 97.5 F (36.4 C)  Wt 245 lb 12.8 oz (111.494 kg) General:   Alert and oriented. No distress noted. Pleasant  and cooperative.  Head:  Normocephalic and atraumatic. Eyes:  Conjuctiva clear without scleral icterus. Abdomen:  +BS, soft, non-tender and non-distended. No rebound or guarding. No HSM or masses noted. Msk:  Symmetrical without gross deformities. Normal posture. Extremities:  Without edema. Neurologic:  Alert and  oriented x4;  grossly normal neurologically. Psych:  Alert and cooperative. Normal mood and affect.  Lab Results  Component Value Date   ALT 13 02/08/2013   AST 13 02/08/2013   ALKPHOS 66 02/08/2013   BILITOT 0.6 02/08/2013

## 2015-03-08 ENCOUNTER — Encounter: Payer: Self-pay | Admitting: Gastroenterology

## 2015-03-08 NOTE — Progress Notes (Signed)
cc'ed to pcp °

## 2015-03-08 NOTE — Assessment & Plan Note (Signed)
EGD on file. H.pylori eradication documented. Needs to trial Aciphex prior to Nexium approval. Sent Aciphex to pharmacy. Will proceed with US abdomen to exclude any biliary component. May need HIDA. As of note, LFTs normal.

## 2015-03-10 ENCOUNTER — Ambulatory Visit (HOSPITAL_COMMUNITY)
Admission: RE | Admit: 2015-03-10 | Discharge: 2015-03-10 | Disposition: A | Discharge status: Home / Self Care | Payer: BLUE CROSS/BLUE SHIELD | Source: Ambulatory Visit | Attending: Gastroenterology | Admitting: Gastroenterology

## 2015-03-10 DIAGNOSIS — R1013 Epigastric pain: Secondary | ICD-10-CM

## 2015-03-10 DIAGNOSIS — E119 Type 2 diabetes mellitus without complications: Secondary | ICD-10-CM | POA: Diagnosis not present

## 2015-03-10 DIAGNOSIS — K219 Gastro-esophageal reflux disease without esophagitis: Secondary | ICD-10-CM | POA: Diagnosis not present

## 2015-03-10 DIAGNOSIS — K760 Fatty (change of) liver, not elsewhere classified: Secondary | ICD-10-CM | POA: Diagnosis not present

## 2015-03-19 NOTE — Progress Notes (Signed)
Quick Note:  Fatty liver but no gallstones. Recommend HIDA. ______

## 2015-03-22 ENCOUNTER — Other Ambulatory Visit: Payer: Self-pay

## 2015-03-23 MED ORDER — SUCRALFATE 1 GM/10ML PO SUSP: 1.0000 g | Freq: Four times a day (QID) | ORAL | Status: DC | PRN

## 2015-03-24 ENCOUNTER — Ambulatory Visit (INDEPENDENT_AMBULATORY_CARE_PROVIDER_SITE_OTHER): Payer: BLUE CROSS/BLUE SHIELD | Admitting: Gastroenterology

## 2015-03-24 ENCOUNTER — Encounter: Payer: Self-pay | Admitting: Gastroenterology

## 2015-03-24 VITALS — BP 114/66 | HR 69 | Temp 97.3°F | Ht 71.0 in | Wt 240.4 lb

## 2015-03-24 DIAGNOSIS — K219 Gastro-esophageal reflux disease without esophagitis: Secondary | ICD-10-CM | POA: Diagnosis not present

## 2015-03-24 DIAGNOSIS — R140 Abdominal distension (gaseous): Secondary | ICD-10-CM | POA: Diagnosis not present

## 2015-03-24 DIAGNOSIS — R1013 Epigastric pain: Secondary | ICD-10-CM | POA: Diagnosis not present

## 2015-03-24 LAB — CBC WITH DIFFERENTIAL/PLATELET
BASOS PCT: 0 % (ref 0–1)
Basophils Absolute: 0 10*3/uL (ref 0.0–0.1)
EOS ABS: 0.2 10*3/uL (ref 0.0–0.7)
Eosinophils Relative: 5 % (ref 0–5)
HCT: 40.2 % (ref 39.0–52.0)
Hemoglobin: 13.5 g/dL (ref 13.0–17.0)
Lymphocytes Relative: 36 % (ref 12–46)
Lymphs Abs: 1.4 10*3/uL (ref 0.7–4.0)
MCH: 29.1 pg (ref 26.0–34.0)
MCHC: 33.6 g/dL (ref 30.0–36.0)
MCV: 86.6 fL (ref 78.0–100.0)
MONOS PCT: 7 % (ref 3–12)
MPV: 10 fL (ref 8.6–12.4)
Monocytes Absolute: 0.3 10*3/uL (ref 0.1–1.0)
NEUTROS PCT: 52 % (ref 43–77)
Neutro Abs: 2 10*3/uL (ref 1.7–7.7)
PLATELETS: 223 10*3/uL (ref 150–400)
RBC: 4.64 MIL/uL (ref 4.22–5.81)
RDW: 12.6 % (ref 11.5–15.5)
WBC: 3.8 10*3/uL — AB (ref 4.0–10.5)

## 2015-03-24 LAB — COMPREHENSIVE METABOLIC PANEL
ALT: 16 U/L (ref 9–46)
AST: 12 U/L (ref 10–35)
Albumin: 4 g/dL (ref 3.6–5.1)
Alkaline Phosphatase: 55 U/L (ref 40–115)
BUN: 19 mg/dL (ref 7–25)
CHLORIDE: 103 mmol/L (ref 98–110)
CO2: 29 mmol/L (ref 20–31)
CREATININE: 0.89 mg/dL (ref 0.70–1.25)
Calcium: 9 mg/dL (ref 8.6–10.3)
Glucose, Bld: 211 mg/dL — ABNORMAL HIGH (ref 65–99)
Potassium: 4 mmol/L (ref 3.5–5.3)
SODIUM: 141 mmol/L (ref 135–146)
TOTAL PROTEIN: 6.1 g/dL (ref 6.1–8.1)
Total Bilirubin: 0.8 mg/dL (ref 0.2–1.2)

## 2015-03-24 LAB — LIPASE: Lipase: 39 U/L (ref 7–60)

## 2015-03-24 MED ORDER — RABEPRAZOLE SODIUM 20 MG PO TBEC: 20.0000 mg | DELAYED_RELEASE_TABLET | Freq: Every day | ORAL | Status: AC

## 2015-03-24 NOTE — Patient Instructions (Addendum)
1. For your reflux, please try Aciphex one tablet 30 minutes before breakfast. You may have at home already, IF NOT there is a prescription at your pharmacy.  2. Stop Nexium for now.  3. Please have your gallbladder function test done as scheduled.  4. Please have your labs done.

## 2015-03-24 NOTE — Progress Notes (Signed)
Primary Care Physician: Rosita Fire, MD  Primary Gastroenterologist:  Garfield Cornea, MD   Chief Complaint  Patient presents with  . Abdominal Pain    bloating     HPI: Benjamin Flynn is a 64 y.o. male here for follow up of GERD, abdominal pain/bloatting postprandially. Seen two weeks ago. Abdominal u/s since then showed fatty liver but normal gallbladder.   Patient reports indigestion, bloating, epigastric pain associated with most meals. Heartburn as well. Currently taking Nexium OTC once daily (samples from our office). Nexium 40mg  daily has worked well in the past but insurance issues. Has to try Aciphex and was given RX before but states he never took it. BM normal. No melena, brbpr.    Current Outpatient Prescriptions  Medication Sig Dispense Refill  . amLODipine (NORVASC) 5 MG tablet Take 5 mg by mouth daily.    Marland Kitchen esomeprazole (NEXIUM) 40 MG capsule Take 1 capsule (40 mg total) by mouth daily at 12 noon. 30 capsule 5  . insulin lispro protamine-lispro (HUMALOG 75/25) (75-25) 100 UNIT/ML SUSP Inject 30 Units into the skin 2 (two) times daily with a meal.     . losartan (COZAAR) 25 MG tablet Take 25 mg by mouth daily. Patient unsure of dose.    . metFORMIN (GLUCOPHAGE) 500 MG tablet Take 1,000 mg by mouth 2 (two) times daily with a meal.     . sucralfate (CARAFATE) 1 GM/10ML suspension Take 10 mLs (1 g total) by mouth 4 (four) times daily as needed. 420 mL 1  . tadalafil (CIALIS) 5 MG tablet Take 5 mg by mouth daily as needed for erectile dysfunction.     No current facility-administered medications for this visit.    Allergies as of 03/24/2015  . (No Known Allergies)    ROS:  General: Negative for fever, chills, fatigue, weakness. See hpi.  ENT: Negative for hoarseness, difficulty swallowing , nasal congestion. CV: Negative for chest pain, angina, palpitations, dyspnea on exertion, peripheral edema.  Respiratory: Negative for dyspnea at rest, dyspnea on exertion,  cough, sputum, wheezing.  GI: See history of present illness. GU:  Negative for dysuria, hematuria, urinary incontinence, urinary frequency, nocturnal urination.  Endo: Negative for unusual weight change.    Physical Examination:   BP 114/66 mmHg  Pulse 69  Temp(Src) 97.3 F (36.3 C) (Oral)  Ht 5\' 11"  (1.803 m)  Wt 240 lb 6.4 oz (109.045 kg)  BMI 33.54 kg/m2  General: Well-nourished, well-developed in no acute distress.  Eyes: No icterus. Mouth: Oropharyngeal mucosa moist and pink , no lesions erythema or exudate. Lungs: Clear to auscultation bilaterally.  Heart: Regular rate and rhythm, no murmurs rubs or gallops.  Abdomen: Bowel sounds are normal, nontender, nondistended, no hepatosplenomegaly or masses, no abdominal bruits or hernia , no rebound or guarding.   Extremities: No lower extremity edema. No clubbing or deformities. Neuro: Alert and oriented x 4   Skin: Warm and dry, no jaundice.   Psych: Alert and cooperative, normal mood and affect.   Imaging Studies: US Abdomen Limited  03/10/2015   CLINICAL DATA:  Epigastric pain for the past week; history of dyspepsia and reflux, diabetes  EXAM: US ABDOMEN LIMITED - RIGHT UPPER QUADRANT  COMPARISON:  Abdominal ultrasound of Dec 01, 2012  FINDINGS: Gallbladder:  No gallstones or wall thickening visualized. No sonographic Murphy sign noted.  Common bile duct:  Diameter: 3.9 mm  Liver:  The liver exhibits mildly increased echotexture with no focal mass nor ductal dilation.  IMPRESSION: No gallstones or other evidence of gallbladder abnormality. Fatty infiltration of the liver.   Electronically Signed   By: David  Martinique M.D.   On: 03/10/2015 08:18

## 2015-03-24 NOTE — Progress Notes (Signed)
Quick Note:  PT is here for OV with Neil Crouch, PA today. ______

## 2015-03-30 NOTE — Progress Notes (Signed)
Quick Note:  Sugar is up in setting of DM but otherwise labs unremarkable. Await HIDA. ______

## 2015-03-30 NOTE — Assessment & Plan Note (Signed)
Postprandial abdominal bloating, epigastric discomfort, indigestion/heartburn. Taking Nexium 20 mg daily, samples provided. Never tried AcipHex is recommended. Recent ultrasound with fatty liver but unremarkable gallbladder. Cannot exclude biliary component. Complete workup with HIDA. Obtain labs. Encouraged stopping Nexium, trial of AcipHex 20 mg daily. If he fails AcipHex he should be over to get Nexium prescription covered, this is worked well for him in the past. Further recommendations to follow.

## 2015-03-30 NOTE — Progress Notes (Signed)
cc'ed to pcp °

## 2015-04-05 ENCOUNTER — Ambulatory Visit (HOSPITAL_COMMUNITY): Payer: BLUE CROSS/BLUE SHIELD

## 2015-04-10 ENCOUNTER — Encounter (HOSPITAL_COMMUNITY)
Admission: RE | Admit: 2015-04-10 | Discharge: 2015-04-10 | Disposition: A | Discharge status: Home / Self Care | Payer: BLUE CROSS/BLUE SHIELD | Source: Ambulatory Visit | Attending: Gastroenterology | Admitting: Gastroenterology

## 2015-04-10 ENCOUNTER — Encounter (HOSPITAL_COMMUNITY): Payer: Self-pay

## 2015-04-10 DIAGNOSIS — K219 Gastro-esophageal reflux disease without esophagitis: Secondary | ICD-10-CM | POA: Diagnosis not present

## 2015-04-10 DIAGNOSIS — R1013 Epigastric pain: Secondary | ICD-10-CM

## 2015-04-10 DIAGNOSIS — R140 Abdominal distension (gaseous): Secondary | ICD-10-CM

## 2015-04-10 DIAGNOSIS — R110 Nausea: Secondary | ICD-10-CM | POA: Diagnosis present

## 2015-04-10 MED ORDER — SODIUM CHLORIDE 0.9 % IJ SOLN
INTRAMUSCULAR | Status: AC
  Filled 2015-04-10: qty 42

## 2015-04-10 MED ORDER — STERILE WATER FOR INJECTION IJ SOLN
INTRAMUSCULAR | Status: AC
  Administered 2015-04-10: 5 mL via INTRAVENOUS
  Filled 2015-04-10: qty 10

## 2015-04-10 MED ORDER — SINCALIDE 5 MCG IJ SOLR
INTRAMUSCULAR | Status: AC
  Administered 2015-04-10: 2.18 ug via INTRAVENOUS
  Filled 2015-04-10: qty 5

## 2015-04-10 MED ORDER — TECHNETIUM TC 99M MEBROFENIN IV KIT
5.0000 | PACK | Freq: Once | INTRAVENOUS | Status: DC | PRN
  Administered 2015-04-10: 5 via INTRAVENOUS
  Filled 2015-04-10: qty 6

## 2015-04-12 ENCOUNTER — Ambulatory Visit: Payer: Self-pay | Admitting: Nutrition

## 2015-04-12 ENCOUNTER — Telehealth: Payer: Self-pay | Admitting: Nutrition

## 2015-04-12 NOTE — Telephone Encounter (Signed)
Called his wife who said he got called into work. Asked her to have him call back and reschedule. PC

## 2015-04-19 NOTE — Progress Notes (Signed)
Quick Note:  HIDA normal.  He should be on Aciphex now since OV. How's is he doing? ______

## 2015-04-21 ENCOUNTER — Ambulatory Visit (INDEPENDENT_AMBULATORY_CARE_PROVIDER_SITE_OTHER): Payer: BLUE CROSS/BLUE SHIELD | Admitting: Urology

## 2015-04-21 DIAGNOSIS — R3915 Urgency of urination: Secondary | ICD-10-CM

## 2015-04-21 DIAGNOSIS — N401 Enlarged prostate with lower urinary tract symptoms: Secondary | ICD-10-CM

## 2015-04-21 DIAGNOSIS — N5201 Erectile dysfunction due to arterial insufficiency: Secondary | ICD-10-CM | POA: Diagnosis not present

## 2015-05-03 ENCOUNTER — Ambulatory Visit (INDEPENDENT_AMBULATORY_CARE_PROVIDER_SITE_OTHER): Payer: BLUE CROSS/BLUE SHIELD | Admitting: "Endocrinology

## 2015-05-03 ENCOUNTER — Encounter: Payer: Self-pay | Admitting: "Endocrinology

## 2015-05-03 ENCOUNTER — Other Ambulatory Visit: Payer: Self-pay

## 2015-05-03 VITALS — BP 124/79 | HR 78 | Ht 68.0 in | Wt 242.0 lb

## 2015-05-03 DIAGNOSIS — Z794 Long term (current) use of insulin: Secondary | ICD-10-CM

## 2015-05-03 DIAGNOSIS — I10 Essential (primary) hypertension: Secondary | ICD-10-CM

## 2015-05-03 DIAGNOSIS — E1165 Type 2 diabetes mellitus with hyperglycemia: Secondary | ICD-10-CM | POA: Diagnosis not present

## 2015-05-03 DIAGNOSIS — E6609 Other obesity due to excess calories: Secondary | ICD-10-CM | POA: Diagnosis not present

## 2015-05-03 DIAGNOSIS — IMO0001 Reserved for inherently not codable concepts without codable children: Secondary | ICD-10-CM

## 2015-05-03 DIAGNOSIS — IMO0002 Reserved for concepts with insufficient information to code with codable children: Secondary | ICD-10-CM | POA: Insufficient documentation

## 2015-05-03 DIAGNOSIS — E118 Type 2 diabetes mellitus with unspecified complications: Secondary | ICD-10-CM

## 2015-05-03 MED ORDER — INSULIN LISPRO PROT & LISPRO (75-25 MIX) 100 UNIT/ML ~~LOC~~ SUSP: 30.0000 [IU] | Freq: Two times a day (BID) | SUBCUTANEOUS | Status: DC

## 2015-05-03 MED ORDER — INSULIN LISPRO PROT & LISPRO (75-25 MIX) 100 UNIT/ML KWIKPEN: 30.0000 [IU] | PEN_INJECTOR | Freq: Two times a day (BID) | SUBCUTANEOUS | Status: DC

## 2015-05-03 MED ORDER — METFORMIN HCL 500 MG PO TABS: 1000.0000 mg | ORAL_TABLET | Freq: Two times a day (BID) | ORAL | Status: DC

## 2015-05-03 MED ORDER — METFORMIN HCL 1000 MG PO TABS: 1000.0000 mg | ORAL_TABLET | Freq: Two times a day (BID) | ORAL | Status: DC

## 2015-05-03 NOTE — Progress Notes (Signed)
Subjective:    Patient ID: Benjamin Flynn, male    DOB: [birth year],    Past Medical History  Diagnosis Date  . HTN (hypertension)   . Diabetes ([place])   . GERD (gastroesophageal reflux disease)   . Arthritis   . Asthma    Past Surgical History  Procedure Laterality Date  . Colonoscopy  2002    Dr. Gala Romney. Normal.  . Colonoscopy with esophagogastroduodenoscopy (egd) N/A 02/10/2013    PHK:FEXMD hiatal hernia. Minimally abnormal-appearing gastric mucosa-status post biopsy. PATH H. PYLORI GASTRITIS:Colonic polyp-TUBULAR ADENOMA   Social History   Social History  . Marital Status: Married    Spouse Name: N/A  . Number of Children: 2  . Years of Education: N/A   Occupational History  .     Social History Main Topics  . Smoking status: Never Smoker   . Smokeless tobacco: None     Comment: Never smoked  . Alcohol Use: No     Comment: former user  . Drug Use: No  . Sexual Activity: Yes    Birth Control/ Protection: None   Other Topics Concern  . None   Social History Narrative   Outpatient Encounter Prescriptions as of 05/03/2015  Medication Sig  . amLODipine (NORVASC) 5 MG tablet Take 5 mg by mouth daily.  . insulin lispro protamine-lispro (HUMALOG 75/25 MIX) (75-25) 100 UNIT/ML SUSP injection Inject 30 Units into the skin 2 (two) times daily with a meal.  . losartan (COZAAR) 25 MG tablet Take 25 mg by mouth daily. Patient unsure of dose.  . metFORMIN (GLUCOPHAGE) 500 MG tablet Take 2 tablets (1,000 mg total) by mouth 2 (two) times daily with a meal.  . omeprazole (PRILOSEC) 20 MG capsule Take 20 mg by mouth daily.  . RABEprazole (ACIPHEX) 20 MG tablet Take 1 tablet (20 mg total) by mouth daily. 30 minutes before breakfast.  . sucralfate (CARAFATE) 1 GM/10ML suspension Take 10 mLs (1 g total) by mouth 4 (four) times daily as needed.  . tadalafil (CIALIS) 5 MG tablet Take 5 mg by mouth daily as needed for erectile dysfunction.  . [DISCONTINUED] insulin lispro  protamine-lispro (HUMALOG 75/25) (75-25) 100 UNIT/ML SUSP Inject 30 Units into the skin 2 (two) times daily with a meal.   . [DISCONTINUED] metFORMIN (GLUCOPHAGE) 500 MG tablet Take 1,000 mg by mouth 2 (two) times daily with a meal.   . metFORMIN (GLUCOPHAGE) 1000 MG tablet Take 1 tablet (1,000 mg total) by mouth 2 (two) times daily with a meal.   No facility-administered encounter medications on file as of 05/03/2015.   ALLERGIES: No Known Allergies  Diabetes He presents for his follow-up diabetic visit. He has type 2 diabetes mellitus. His disease course has been improving. There are no hypoglycemic associated symptoms. Pertinent negatives for hypoglycemia include no confusion, headaches, pallor or seizures. There are no diabetic associated symptoms. Pertinent negatives for diabetes include no chest pain, no fatigue, no polydipsia, no polyphagia, no polyuria and no weakness. There are no hypoglycemic complications. Symptoms are stable. There are no diabetic complications. Risk factors for coronary artery disease include dyslipidemia, diabetes mellitus, hypertension, male sex, obesity and sedentary lifestyle. He is compliant with treatment most of the time. His weight is stable. He rarely participates in exercise. His overall blood glucose range is 180-200 mg/dl. An ACE inhibitor/angiotensin II receptor blocker is being taken.  Hypertension This is a chronic problem. The current episode started more than 1 year ago. Pertinent negatives include no chest pain,  headaches, neck pain, palpitations or shortness of breath. Past treatments include alpha 1 blockers.  Hyperlipidemia This is a chronic problem. The current episode started more than 1 year ago. Pertinent negatives include no chest pain, myalgias or shortness of breath.     Review of Systems  Constitutional: Negative for fatigue and unexpected weight change.  HENT: Negative for dental problem, mouth sores and trouble swallowing.   Eyes:  Negative for visual disturbance.  Respiratory: Negative for cough, choking, chest tightness, shortness of breath and wheezing.   Cardiovascular: Negative for chest pain, palpitations and leg swelling.  Gastrointestinal: Negative for nausea, vomiting, abdominal pain, diarrhea, constipation and abdominal distention.  Endocrine: Negative for polydipsia, polyphagia and polyuria.  Genitourinary: Negative for dysuria, urgency, hematuria and flank pain.  Musculoskeletal: Negative for myalgias, back pain, gait problem and neck pain.  Skin: Negative for pallor, rash and wound.  Neurological: Negative for seizures, syncope, weakness, numbness and headaches.  Psychiatric/Behavioral: Negative.  Negative for confusion and dysphoric mood.    Objective:    BP 124/79 mmHg  Pulse 78  Ht '5\' 8"'  (1.727 m)  Wt 242 lb (109.77 kg)  BMI 36.80 kg/m2  SpO2 97%  Wt Readings from Last 3 Encounters:  05/03/15 242 lb (109.77 kg)  03/24/15 240 lb 6.4 oz (109.045 kg)  03/07/15 245 lb 12.8 oz (111.494 kg)    Physical Exam  Constitutional: He is oriented to person, place, and time. He appears well-developed and well-nourished. He is cooperative. No distress.  HENT:  Head: Normocephalic and atraumatic.  Eyes: EOM are normal.  Neck: Normal range of motion. Neck supple. No tracheal deviation present. No thyromegaly present.  Cardiovascular: Normal rate, S1 normal, S2 normal and normal heart sounds.  Exam reveals no gallop.   No murmur heard. Pulses:      Dorsalis pedis pulses are 1+ on the right side, and 1+ on the left side.       Posterior tibial pulses are 1+ on the right side, and 1+ on the left side.  Pulmonary/Chest: Breath sounds normal. No respiratory distress. He has no wheezes.  Abdominal: Soft. Bowel sounds are normal. He exhibits no distension. There is no tenderness. There is no guarding and no CVA tenderness.  Musculoskeletal: He exhibits no edema.       Right shoulder: He exhibits no swelling and no  deformity.  Neurological: He is alert and oriented to person, place, and time. He has normal strength and normal reflexes. No cranial nerve deficit or sensory deficit. Gait normal.  Skin: Skin is warm and dry. No rash noted. No cyanosis. Nails show no clubbing.  Psychiatric: He has a normal mood and affect. His speech is normal and behavior is normal. Judgment and thought content normal. Cognition and memory are normal.    Results for orders placed or performed in visit on 03/24/15  CBC with Differential/Platelet  Result Value Ref Range   WBC 3.8 (L) 4.0 - 10.5 K/uL   RBC 4.64 4.22 - 5.81 MIL/uL   Hemoglobin 13.5 13.0 - 17.0 g/dL   HCT 40.2 39.0 - 52.0 %   MCV 86.6 78.0 - 100.0 fL   MCH 29.1 26.0 - 34.0 pg   MCHC 33.6 30.0 - 36.0 g/dL   RDW 12.6 11.5 - 15.5 %   Platelets 223 150 - 400 K/uL   MPV 10.0 8.6 - 12.4 fL   Neutrophils Relative % 52 43 - 77 %   Neutro Abs 2.0 1.7 - 7.7 K/uL  Lymphocytes Relative 36 12 - 46 %   Lymphs Abs 1.4 0.7 - 4.0 K/uL   Monocytes Relative 7 3 - 12 %   Monocytes Absolute 0.3 0.1 - 1.0 K/uL   Eosinophils Relative 5 0 - 5 %   Eosinophils Absolute 0.2 0.0 - 0.7 K/uL   Basophils Relative 0 0 - 1 %   Basophils Absolute 0.0 0.0 - 0.1 K/uL   Smear Review Criteria for review not met   Comprehensive metabolic panel  Result Value Ref Range   Sodium 141 135 - 146 mmol/L   Potassium 4.0 3.5 - 5.3 mmol/L   Chloride 103 98 - 110 mmol/L   CO2 29 20 - 31 mmol/L   Glucose, Bld 211 (H) 65 - 99 mg/dL   BUN 19 7 - 25 mg/dL   Creat 0.89 0.70 - 1.25 mg/dL   Total Bilirubin 0.8 0.2 - 1.2 mg/dL   Alkaline Phosphatase 55 40 - 115 U/L   AST 12 10 - 35 U/L   ALT 16 9 - 46 U/L   Total Protein 6.1 6.1 - 8.1 g/dL   Albumin 4.0 3.6 - 5.1 g/dL   Calcium 9.0 8.6 - 10.3 mg/dL  Lipase  Result Value Ref Range   Lipase 39 7 - 60 U/L   Complete Blood Count (Most recent): Lab Results  Component Value Date   WBC 3.8* 03/24/2015   HGB 13.5 03/24/2015   HCT 40.2 03/24/2015    MCV 86.6 03/24/2015   PLT 223 03/24/2015   Chemistry (most recent): Lab Results  Component Value Date   NA 141 03/24/2015   K 4.0 03/24/2015   CL 103 03/24/2015   CO2 29 03/24/2015   BUN 19 03/24/2015   CREATININE 0.89 03/24/2015   Diabetic Labs (most recent): No results found for: HGBA1C Lipid profile (most recent): No results found for: TRIG, CHOL       Assessment & Plan:   1. Uncontrolled type 2 diabetes mellitus without complication, with long-term current use of insulin ([place])  Patient came with improved  glucose profile, and  recent A1c of  Undetermined.  Glucose logs and insulin administration records pertaining to this visit,  to be scanned into patient's records.    - Patient remains at a high risk for more acute and chronic complications of diabetes which include CAD, CVA, CKD, retinopathy, and neuropathy. These are all discussed in detail with the patient.  - I have re-counseled the patient on diet management and  Weight loss  by adopting a carbohydrate restricted / protein rich  Diet. - Patient is advised to stick to a routine mealtimes to eat 3 meals  a day and avoid unnecessary snacks ( to snack only to correct hypoglycemia).  - Suggestion is made for patient to avoid simple carbohydrates   from their diet including Cakes , Desserts, Ice Cream,  Soda (  diet and regular) , Sweet Tea , Candies,  Chips, Cookies, Artificial Sweeteners,   and "Sugar-free" Products .  This will help patient to have stable blood glucose profile and potentially avoid unintended  Weight gain.  - The patient has been  scheduled with Jearld Fenton, RDN, CDE for individualized DM education. - I have approached patient with the following individualized plan to manage diabetes and patient agrees.  -I advised him to continue monitoring BG AC x 3 . I will continue Humalog 75/25 30 units with breakfast and supper for premeal readings of >90.  - he may need basal/bolus insulin  based on his  readings and his commitments.  - he will continue on Metformin 1gm bid, therapeutically suitable for patient.. - Patient is warned not to take insulin without proper monitoring per orders. -Adjustment parameters are given for hypo and hyperglycemia in writing. -Patient is encouraged to call clinic for blood glucose levels less than 70 or above 300 mg /dl.   - Patient will be considered for incretin therapy as appropriate next visit. - Patient specific target  for A1c; LDL, HDL, Triglycerides, and  Waist Circumference were discussed in detail.  2) BP/HTN: Controlled. Continue current medications including  ARB. 3) HPL- Continue Welchol. 4)  Weight/Diet: CDE consult in progress, exercise, and carbohydrates information provided.  5) Chronic Care/Health Maintenance:  -Patient ARB  and encouraged to continue to follow up with Ophthalmology, Podiatrist at least yearly or according to recommendations, and advised to stay away from smoking. I have recommended yearly flu vaccine and pneumonia vaccination at least every 5 years; moderate intensity exercise for up to 150 minutes weekly; and  sleep for at least 7 hours a day.  I advised patient to maintain close follow up with their PCP for primary care needs.  Patient is asked to bring meter and  blood glucose logs during their next visit.   Follow up plan: Return in about 2 months (around 07/03/2015) for diabetes, high blood pressure, high cholesterol, follow up with pre-visit labs, meter, and logs.  Glade Lloyd, MD Phone: [phone]  Fax: [phone]   05/03/2015, 1:40 PM

## 2015-05-03 NOTE — Patient Instructions (Signed)
Advice for weight management -For most of us the best way to lose weight is by diet management. Generally speaking, diet management means restricting carbohydrate consumption to minimum possible (and to unprocessed or minimally processed complex starch) and increasing protein intake (animal or plant source), fruits, and vegetables.  -Sticking to a routine mealtime to eat 3 meals a day and avoiding unnecessary snacks is shown to have a big role in weight control.  -It is better to avoid simple carbohydrates including: Cakes, Desserts, Ice Cream, Soda (diet and regular), Sweet Tea, Candies, Chips, Cookies, Artificial Sweeteners, and "Sugar-free" Products.   -Exercise: 30 minutes a day 3-4 days a week, or 150 minutes a week. Combine stretch, strength, and aerobic activities. You may seek evaluation by your heart doctor prior to initiating exercise if you have high risk for heart disease.  -If you are interested, we can schedule a visit with Benjamin Flynn, RDN, CDE for individualized nutrition education.  

## 2015-05-04 ENCOUNTER — Other Ambulatory Visit: Payer: Self-pay

## 2015-05-04 MED ORDER — INSULIN LISPRO PROT & LISPRO (75-25 MIX) 100 UNIT/ML KWIKPEN: 30.0000 [IU] | PEN_INJECTOR | Freq: Two times a day (BID) | SUBCUTANEOUS | Status: DC

## 2015-05-29 ENCOUNTER — Ambulatory Visit: Payer: BLUE CROSS/BLUE SHIELD | Admitting: Nutrition

## 2015-07-06 ENCOUNTER — Ambulatory Visit: Payer: BLUE CROSS/BLUE SHIELD | Admitting: "Endocrinology

## 2015-07-08 ENCOUNTER — Other Ambulatory Visit: Payer: Self-pay | Admitting: Gastroenterology

## 2015-07-12 ENCOUNTER — Telehealth: Payer: Self-pay | Admitting: Nutrition

## 2015-07-12 DIAGNOSIS — E1169 Type 2 diabetes mellitus with other specified complication: Principal | ICD-10-CM

## 2015-07-12 DIAGNOSIS — E1165 Type 2 diabetes mellitus with hyperglycemia: Secondary | ICD-10-CM

## 2015-07-12 DIAGNOSIS — IMO0002 Reserved for concepts with insufficient information to code with codable children: Secondary | ICD-10-CM

## 2015-07-12 NOTE — Telephone Encounter (Signed)
Can you please put in a new referral for Mr. Benjamin. Flynn to see me? The last one has expired and he is rescheduled to be see in January. Thanks, [employer]

## 2015-07-12 NOTE — Telephone Encounter (Signed)
Talked with his wife and requested pt. Call to reschedule his missed appt.

## 2015-07-18 ENCOUNTER — Other Ambulatory Visit: Payer: Self-pay | Admitting: "Endocrinology

## 2015-08-16 ENCOUNTER — Other Ambulatory Visit: Payer: Self-pay | Admitting: "Endocrinology

## 2015-08-16 ENCOUNTER — Ambulatory Visit: Payer: BLUE CROSS/BLUE SHIELD | Admitting: Nutrition

## 2015-08-16 LAB — BASIC METABOLIC PANEL
BUN: 18 mg/dL (ref 7–25)
CO2: 26 mmol/L (ref 20–31)
Calcium: 9.1 mg/dL (ref 8.6–10.3)
Chloride: 104 mmol/L (ref 98–110)
Creat: 0.82 mg/dL (ref 0.70–1.25)
Glucose, Bld: 140 mg/dL — ABNORMAL HIGH (ref 65–99)
POTASSIUM: 3.7 mmol/L (ref 3.5–5.3)
SODIUM: 138 mmol/L (ref 135–146)

## 2015-08-16 LAB — HEMOGLOBIN A1C
HEMOGLOBIN A1C: 9.5 % — AB (ref <5.7)
MEAN PLASMA GLUCOSE: 226 mg/dL — AB (ref <117)

## 2015-09-04 ENCOUNTER — Ambulatory Visit: Payer: BLUE CROSS/BLUE SHIELD | Admitting: Nutrition

## 2015-09-07 ENCOUNTER — Encounter: Payer: Self-pay | Admitting: Gastroenterology

## 2015-09-07 ENCOUNTER — Ambulatory Visit (INDEPENDENT_AMBULATORY_CARE_PROVIDER_SITE_OTHER): Payer: BLUE CROSS/BLUE SHIELD | Admitting: Gastroenterology

## 2015-09-07 VITALS — BP 135/74 | HR 77 | Temp 97.7°F | Ht 71.0 in | Wt 243.0 lb

## 2015-09-07 DIAGNOSIS — R1013 Epigastric pain: Secondary | ICD-10-CM | POA: Diagnosis not present

## 2015-09-07 MED ORDER — ESOMEPRAZOLE MAGNESIUM 40 MG PO CPDR: 40.0000 mg | DELAYED_RELEASE_CAPSULE | ORAL | Status: DC

## 2015-09-07 NOTE — Progress Notes (Signed)
Referring Provider: Rosita Fire, MD Primary Care Physician:  Rosita Fire, MD  Primary GI: Dr. Gala Romney   Chief Complaint  Patient presents with  . Gastroesophageal Reflux    HPI:   Benjamin Flynn is a 65 y.o. male presenting today with a history of GERD, abdominal pain, bloating. US abdomen with fatty liver but normal gallbladder. HIDA normal at 66%. Weight is stable. Colonoscopy with EGD in 2014. H.pylori gastritis noted but successful eradication noted thereafter with negative urea breath test.   Appetite is gone. Just nibbles. Pain in upper abdomen, especially with greasy foods. He tells me he had no pain with CCK infusion.  Aciphex without any help. Nexium has worked better in the past. Has had to trial Aciphex before Nexium approval. Feels like he has indigestion all the time. States bowels are smooth, like soft serve ice cream. This hasn't changed. Greasy foods cause issues. Greasy foods make him go to the bathroom and have pain. Sticking with grilled chicken, [place] burgers, [place] sandwiches. Pain reports are vague.   Past Medical History  Diagnosis Date  . HTN (hypertension)   . Diabetes ([place])   . GERD (gastroesophageal reflux disease)   . Arthritis   . Asthma     Past Surgical History  Procedure Laterality Date  . Colonoscopy  2002    Dr. Gala Romney. Normal.  . Colonoscopy with esophagogastroduodenoscopy (egd) N/A 02/10/2013    [id number] hiatal hernia. Minimally abnormal-appearing gastric mucosa-status post biopsy. PATH H. PYLORI GASTRITIS:Colonic polyp-TUBULAR ADENOMA    Current Outpatient Prescriptions  Medication Sig Dispense Refill  . amLODipine (NORVASC) 5 MG tablet Take 5 mg by mouth daily.    Marland Kitchen CARAFATE 1 GM/10ML suspension TAKE  10 ML BY MOUTH 4 TIMES DAILY AS NEEDED 420 mL 1  . Insulin Lispro Prot & Lispro (HUMALOG MIX 75/25 KWIKPEN) (75-25) 100 UNIT/ML Kwikpen Inject 30 Units into the skin 2 (two) times daily. 15 mL 3  . losartan (COZAAR) 25 MG tablet Take 25 mg  by mouth daily. Patient unsure of dose.    . metFORMIN (GLUCOPHAGE) 1000 MG tablet Take 1 tablet (1,000 mg total) by mouth 2 (two) times daily with a meal. 180 tablet 3  . metFORMIN (GLUCOPHAGE) 500 MG tablet Take 2 tablets (1,000 mg total) by mouth 2 (two) times daily with a meal. 180 tablet 2  . NOVOFINE 30G X 8 MM MISC USE TWICE DAILY 100 each 5  . ONE TOUCH ULTRA TEST test strip USE ONE STRIP TO CHECK BLOOD SUGAR TWICE DAILY. 100 each 5  . RABEprazole (ACIPHEX) 20 MG tablet Take 1 tablet (20 mg total) by mouth daily. 30 minutes before breakfast. 30 tablet 5  . tadalafil (CIALIS) 5 MG tablet Take 5 mg by mouth daily as needed for erectile dysfunction.    Marland Kitchen esomeprazole (NEXIUM) 40 MG capsule Take 1 capsule (40 mg total) by mouth every morning. 90 capsule 3   No current facility-administered medications for this visit.    Allergies as of 09/07/2015  . (No Known Allergies)    Family History  Problem Relation Age of Onset  . Lung cancer Father   . Colon cancer Neg Hx     Social History   Social History  . Marital Status: Married    Spouse Name: N/A  . Number of Children: 2  . Years of Education: N/A   Occupational History  .     Social History Main Topics  . Smoking status: Never Smoker   . Smokeless  tobacco: None     Comment: Never smoked  . Alcohol Use: No     Comment: former user  . Drug Use: No  . Sexual Activity: Yes    Birth Control/ Protection: None   Other Topics Concern  . None   Social History Narrative    Review of Systems: As mentioned in HPI   Physical Exam: BP 135/74 mmHg  Pulse 77  Temp(Src) 97.7 F (36.5 C)  Ht 5\' 11"  (1.803 m)  Wt 243 lb (110.224 kg)  BMI 33.91 kg/m2 General:   Alert and oriented. No distress noted. Pleasant and cooperative.  Head:  Normocephalic and atraumatic. Eyes:  Conjuctiva clear without scleral icterus. Abdomen:  +BS, soft, mild TTP LUQ and non-distended. No rebound or guarding. No HSM or masses noted. Msk:   Symmetrical without gross deformities. Normal posture. Extremities:  Without edema. Neurologic:  Alert and  oriented x4;  grossly normal neurologically. Psych:  Alert and cooperative. Normal mood and affect.

## 2015-09-07 NOTE — Assessment & Plan Note (Signed)
65 year old male with vague upper abdominal pain, early satiety. No concerning features such as weight loss, rectal bleeding, change in bowel habits. US abdomen and HIDA completed. Query non-ulcer dyspepsia. Highly doubt occult malignancy or mesenteric vasculature abnormalities; however, he has not had abdominal imaging in the way of a CT scan. Will pursue CTA, add probiotic, and avoid greasy foods. It does appear that the majority of his symptoms are secondary to dietary intake. Further recommendations to follow.

## 2015-09-07 NOTE — Progress Notes (Signed)
cc'ed to pcp °

## 2015-09-07 NOTE — Patient Instructions (Signed)
We have scheduled you for a special CT scan. You will need to NOT TAKE YOUR METFORMIN the day of the scan, and hold it for 72 hours after.   Start taking a probiotic once daily. I have provided samples. Some examples include Align, Restora, [employer], Digestive Advantage.   Stop Aciphex. Start Nexium again. I have sent this to your pharmacy.

## 2015-09-11 ENCOUNTER — Other Ambulatory Visit: Payer: Self-pay | Admitting: Nurse Practitioner

## 2015-09-12 ENCOUNTER — Ambulatory Visit (INDEPENDENT_AMBULATORY_CARE_PROVIDER_SITE_OTHER): Payer: BLUE CROSS/BLUE SHIELD | Admitting: "Endocrinology

## 2015-09-12 ENCOUNTER — Encounter: Payer: Self-pay | Admitting: "Endocrinology

## 2015-09-12 VITALS — BP 142/88 | HR 62 | Ht 71.0 in | Wt 246.0 lb

## 2015-09-12 DIAGNOSIS — E1165 Type 2 diabetes mellitus with hyperglycemia: Secondary | ICD-10-CM | POA: Diagnosis not present

## 2015-09-12 DIAGNOSIS — I10 Essential (primary) hypertension: Secondary | ICD-10-CM | POA: Diagnosis not present

## 2015-09-12 DIAGNOSIS — Z794 Long term (current) use of insulin: Secondary | ICD-10-CM | POA: Diagnosis not present

## 2015-09-12 DIAGNOSIS — IMO0001 Reserved for inherently not codable concepts without codable children: Secondary | ICD-10-CM

## 2015-09-12 MED ORDER — INSULIN PEN NEEDLE 31G X 8 MM MISC: 1.0000 | Status: DC

## 2015-09-12 MED ORDER — INSULIN LISPRO PROT & LISPRO (75-25 MIX) 100 UNIT/ML KWIKPEN: 30.0000 [IU] | PEN_INJECTOR | Freq: Two times a day (BID) | SUBCUTANEOUS | Status: DC

## 2015-09-12 MED ORDER — GLUCOSE BLOOD VI STRP
ORAL_STRIP | Status: DC
Start: 2015-09-12 — End: 2015-11-28

## 2015-09-12 MED ORDER — GLUCOSE BLOOD VI STRP: ORAL_STRIP | Status: DC

## 2015-09-12 NOTE — Patient Instructions (Signed)

## 2015-09-12 NOTE — Progress Notes (Signed)
Subjective:    Patient ID: Benjamin Flynn, male    DOB: 10-21-1950,    Past Medical History  Diagnosis Date  . HTN (hypertension)   . Diabetes ([place])   . GERD (gastroesophageal reflux disease)   . Arthritis   . Asthma    Past Surgical History  Procedure Laterality Date  . Colonoscopy  2002    Dr. Gala Romney. Normal.  . Colonoscopy with esophagogastroduodenoscopy (egd) N/A 02/10/2013    [id number] hiatal hernia. Minimally abnormal-appearing gastric mucosa-status post biopsy. PATH H. PYLORI GASTRITIS:Colonic polyp-TUBULAR ADENOMA   Social History   Social History  . Marital Status: Married    Spouse Name: N/A  . Number of Children: 2  . Years of Education: N/A   Occupational History  .     Social History Main Topics  . Smoking status: Never Smoker   . Smokeless tobacco: None     Comment: Never smoked  . Alcohol Use: No     Comment: former user  . Drug Use: No  . Sexual Activity: Yes    Birth Control/ Protection: None   Other Topics Concern  . None   Social History Narrative   Outpatient Encounter Prescriptions as of 09/12/2015  Medication Sig  . amLODipine (NORVASC) 5 MG tablet Take 5 mg by mouth daily.  Marland Kitchen CARAFATE 1 GM/10ML suspension TAKE  10 ML BY MOUTH 4 TIMES DAILY AS NEEDED  . esomeprazole (NEXIUM) 40 MG capsule Take 1 capsule (40 mg total) by mouth every morning.  Marland Kitchen glucose blood (ONE TOUCH ULTRA TEST) test strip Use as instructed  To test glucose 2 times a day.  . Insulin Lispro Prot & Lispro (HUMALOG MIX 75/25 KWIKPEN) (75-25) 100 UNIT/ML Kwikpen Inject 30 Units into the skin 2 (two) times daily.  . Insulin Pen Needle (B-D ULTRAFINE III SHORT PEN) 31G X 8 MM MISC 1 each by Does not apply route as directed.  Marland Kitchen losartan (COZAAR) 25 MG tablet Take 25 mg by mouth daily. Patient unsure of dose.  . metFORMIN (GLUCOPHAGE) 1000 MG tablet Take 1 tablet (1,000 mg total) by mouth 2 (two) times daily with a meal.  . NOVOFINE 30G X 8 MM MISC USE TWICE DAILY  . RABEprazole  (ACIPHEX) 20 MG tablet Take 1 tablet (20 mg total) by mouth daily. 30 minutes before breakfast.  . tadalafil (CIALIS) 5 MG tablet Take 5 mg by mouth daily as needed for erectile dysfunction.  . [DISCONTINUED] glucose blood (ONE TOUCH ULTRA TEST) test strip Use as instructed  To test glucose 2 times a day.  . [DISCONTINUED] Insulin Lispro Prot & Lispro (HUMALOG MIX 75/25 KWIKPEN) (75-25) 100 UNIT/ML Kwikpen Inject 30 Units into the skin 2 (two) times daily.  . [DISCONTINUED] Insulin Lispro Prot & Lispro (HUMALOG MIX 75/25 KWIKPEN) (75-25) 100 UNIT/ML Kwikpen Inject 30 Units into the skin 2 (two) times daily.  . [DISCONTINUED] Insulin Pen Needle (B-D ULTRAFINE III SHORT PEN) 31G X 8 MM MISC 1 each by Does not apply route as directed.  . [DISCONTINUED] metFORMIN (GLUCOPHAGE) 500 MG tablet Take 2 tablets (1,000 mg total) by mouth 2 (two) times daily with a meal.  . [DISCONTINUED] ONE TOUCH ULTRA TEST test strip USE ONE STRIP TO CHECK BLOOD SUGAR TWICE DAILY.   No facility-administered encounter medications on file as of 09/12/2015.   ALLERGIES: No Known Allergies  Diabetes He presents for his follow-up diabetic visit. He has type 2 diabetes mellitus. His disease course has been worsening. There are no  hypoglycemic associated symptoms. Pertinent negatives for hypoglycemia include no confusion, headaches, pallor or seizures. Associated symptoms include polydipsia and polyuria. Pertinent negatives for diabetes include no chest pain, no fatigue, no polyphagia and no weakness. There are no hypoglycemic complications. Symptoms are worsening. There are no diabetic complications. Risk factors for coronary artery disease include dyslipidemia, diabetes mellitus, hypertension, male sex, obesity and sedentary lifestyle. He is compliant with treatment most of the time (He did not comply with insulin instructions. He did not monitor enough). His weight is stable. He rarely participates in exercise. His overall blood  glucose range is 180-200 mg/dl. An ACE inhibitor/angiotensin II receptor blocker is being taken.  Hypertension This is a chronic problem. The current episode started more than 1 year ago. Pertinent negatives include no chest pain, headaches, neck pain, palpitations or shortness of breath. Past treatments include alpha 1 blockers.  Hyperlipidemia This is a chronic problem. The current episode started more than 1 year ago. Pertinent negatives include no chest pain, myalgias or shortness of breath.     Review of Systems  Constitutional: Negative for fatigue and unexpected weight change.  HENT: Negative for dental problem, mouth sores and trouble swallowing.   Eyes: Negative for visual disturbance.  Respiratory: Negative for cough, choking, chest tightness, shortness of breath and wheezing.   Cardiovascular: Negative for chest pain, palpitations and leg swelling.  Gastrointestinal: Negative for nausea, vomiting, abdominal pain, diarrhea, constipation and abdominal distention.  Endocrine: Positive for polydipsia and polyuria. Negative for polyphagia.  Genitourinary: Negative for dysuria, urgency, hematuria and flank pain.  Musculoskeletal: Negative for myalgias, back pain, gait problem and neck pain.  Skin: Negative for pallor, rash and wound.  Neurological: Negative for seizures, syncope, weakness, numbness and headaches.  Psychiatric/Behavioral: Negative.  Negative for confusion and dysphoric mood.    Objective:    BP 142/88 mmHg  Pulse 62  Ht 5\' 11"  (1.803 m)  Wt 246 lb (111.585 kg)  BMI 34.33 kg/m2  SpO2 98%  Wt Readings from Last 3 Encounters:  09/12/15 246 lb (111.585 kg)  09/07/15 243 lb (110.224 kg)  05/03/15 242 lb (109.77 kg)    Physical Exam  Constitutional: He is oriented to person, place, and time. He appears well-developed and well-nourished. He is cooperative. No distress.  HENT:  Head: Normocephalic and atraumatic.  Eyes: EOM are normal.  Neck: Normal range of  motion. Neck supple. No tracheal deviation present. No thyromegaly present.  Cardiovascular: Normal rate, S1 normal, S2 normal and normal heart sounds.  Exam reveals no gallop.   No murmur heard. Pulses:      Dorsalis pedis pulses are 1+ on the right side, and 1+ on the left side.       Posterior tibial pulses are 1+ on the right side, and 1+ on the left side.  Pulmonary/Chest: Breath sounds normal. No respiratory distress. He has no wheezes.  Abdominal: Soft. Bowel sounds are normal. He exhibits no distension. There is no tenderness. There is no guarding and no CVA tenderness.  Musculoskeletal: He exhibits no edema.       Right shoulder: He exhibits no swelling and no deformity.  Neurological: He is alert and oriented to person, place, and time. He has normal strength and normal reflexes. No cranial nerve deficit or sensory deficit. Gait normal.  Skin: Skin is warm and dry. No rash noted. No cyanosis. Nails show no clubbing.  Psychiatric: He has a normal mood and affect. His speech is normal and behavior is normal. Judgment and  thought content normal. Cognition and memory are normal.    Results for orders placed or performed in visit on [id number]  Basic metabolic panel  Result Value Ref Range   Sodium 138 135 - 146 mmol/L   Potassium 3.7 3.5 - 5.3 mmol/L   Chloride 104 98 - 110 mmol/L   CO2 26 20 - 31 mmol/L   Glucose, Bld 140 (H) 65 - 99 mg/dL   BUN 18 7 - 25 mg/dL   Creat 0.82 0.70 - 1.25 mg/dL   Calcium 9.1 8.6 - 10.3 mg/dL  Hemoglobin A1c  Result Value Ref Range   Hgb A1c MFr Bld 9.5 (H) <5.7 %   Mean Plasma Glucose 226 (H) <117 mg/dL   Complete Blood Count (Most recent): Lab Results  Component Value Date   WBC 3.8* 03/24/2015   HGB 13.5 03/24/2015   HCT 40.2 03/24/2015   MCV 86.6 03/24/2015   PLT 223 03/24/2015   Chemistry (most recent): Lab Results  Component Value Date   NA 138 08/16/2015   K 3.7 08/16/2015   CL 104 08/16/2015   CO2 26 08/16/2015   BUN 18  08/16/2015   CREATININE 0.82 08/16/2015   Diabetic Labs (most recent): Lab Results  Component Value Date   HGBA1C 9.5* 08/16/2015   Lipid profile (most recent): No results found for: TRIG, CHOL       Assessment & Plan:   1. Uncontrolled type 2 diabetes mellitus without complication, with long-term current use of insulin (Benjamin Flynn)  Patient came with worsening glucose profile, and  recent A1c of  9.5%.  Glucose logs and insulin administration records pertaining to this visit,  to be scanned into patient's records.    - Patient remains at a high risk for more acute and chronic complications of diabetes which include CAD, CVA, CKD, retinopathy, and neuropathy. These are all discussed in detail with the patient.  - I have re-counseled the patient on diet management and  Weight loss  by adopting a carbohydrate restricted / protein rich  Diet. - Patient is advised to stick to a routine mealtimes to eat 3 meals  a day and avoid unnecessary snacks ( to snack only to correct hypoglycemia).  - Suggestion is made for patient to avoid simple carbohydrates   from their diet including Cakes , Desserts, Ice Cream,  Soda (  diet and regular) , Sweet Tea , Candies,  Chips, Cookies, Artificial Sweeteners,   and "Sugar-free" Products .  This will help patient to have stable blood glucose profile and potentially avoid unintended  Weight gain.  - The patient has been  scheduled with Jearld Fenton, RDN, CDE for individualized DM education. - I have approached patient with the following individualized plan to manage diabetes and patient agrees.  -I advised him to resume monitoring BG AC x 3 . I will continue Humalog 75/25 30 units with breakfast and supper for premeal readings of >90.  - he may need basal/bolus insulin based on his readings and his commitments.  - he will continue on Metformin 1gm bid, therapeutically suitable for patient. - Patient is warned not to take insulin without proper monitoring per  orders. -Adjustment parameters are given for hypo and hyperglycemia in writing. -Patient is encouraged to call clinic for blood glucose levels less than 70 or above 300 mg /dl.   - Patient specific target  for A1c; LDL, HDL, Triglycerides, and  Waist Circumference were discussed in detail.  2) BP/HTN: Controlled. Continue current medications including  ARB. 3) HPL- Continue Welchol. 4)  Weight/Diet: CDE consult in progress, exercise, and carbohydrates information provided.  5) Chronic Care/Health Maintenance:  -Patient ARB  and encouraged to continue to follow up with Ophthalmology, Podiatrist at least yearly or according to recommendations, and advised to stay away from smoking. I have recommended yearly flu vaccine and pneumonia vaccination at least every 5 years; moderate intensity exercise for up to 150 minutes weekly; and  sleep for at least 7 hours a day.  I advised patient to maintain close follow up with their PCP for primary care needs.  Patient is asked to bring meter and  blood glucose logs during their next visit.   Follow up plan: Return in about 10 weeks (around 11/21/2015) for diabetes, high blood pressure, high cholesterol, follow up with pre-visit labs, meter, and logs.  Glade Lloyd, MD Phone: [phone]  Fax: [phone]   09/12/2015, 12:01 PM

## 2015-09-21 ENCOUNTER — Telehealth: Payer: Self-pay | Admitting: Internal Medicine

## 2015-09-21 NOTE — Telephone Encounter (Signed)
Pt called asking for AS or JL. Insurance will not approve his Nexium and he needs to speak with JL. Please call [phone]

## 2015-09-22 ENCOUNTER — Encounter: Payer: Self-pay | Admitting: Gastroenterology

## 2015-09-22 NOTE — Telephone Encounter (Signed)
PA has been done with [facility]. Received a denial from them stating pt has not tried all of their formulary. Pt HAS tried all of their formulary medications and that was put on the PA form. He had to try and fail: omeprazole, pantoprazole, lansoprazole and rabeprazole. Pt has tried all of these and I dont know why they denied it. Paper work says we have to do an appeal letter and ask for an "urgent appeal" or they will take up to 45 days to do a standard appeal.

## 2015-09-22 NOTE — Telephone Encounter (Signed)
I have written a letter. Please do the urgent appeal. I will talk to them if needed. This is ridiculous. Thanks for your help!

## 2015-09-22 NOTE — Telephone Encounter (Signed)
Letter has been faxed to [facility]. Requested urgent review. Pt is aware.

## 2015-09-28 ENCOUNTER — Telehealth: Payer: Self-pay

## 2015-09-28 ENCOUNTER — Telehealth: Payer: Self-pay | Admitting: Internal Medicine

## 2015-09-28 ENCOUNTER — Ambulatory Visit: Payer: BLUE CROSS/BLUE SHIELD | Admitting: Nutrition

## 2015-09-28 NOTE — Telephone Encounter (Signed)
See TC from 09/28/15

## 2015-09-28 NOTE — Telephone Encounter (Signed)
Pt is set up for CT scan on 10/04/15 @ 8:30. He is aware of appointment and time.He did not need a PA for the CT scan.

## 2015-09-28 NOTE — Telephone Encounter (Signed)
Pt LMOM at 59pm yesterday saying that he was returning a call. [id number]

## 2015-09-29 ENCOUNTER — Ambulatory Visit (INDEPENDENT_AMBULATORY_CARE_PROVIDER_SITE_OTHER): Payer: BLUE CROSS/BLUE SHIELD | Admitting: Urology

## 2015-09-29 DIAGNOSIS — R972 Elevated prostate specific antigen [PSA]: Secondary | ICD-10-CM | POA: Diagnosis not present

## 2015-09-29 DIAGNOSIS — N5201 Erectile dysfunction due to arterial insufficiency: Secondary | ICD-10-CM

## 2015-09-29 DIAGNOSIS — N401 Enlarged prostate with lower urinary tract symptoms: Secondary | ICD-10-CM | POA: Diagnosis not present

## 2015-10-04 ENCOUNTER — Other Ambulatory Visit (HOSPITAL_COMMUNITY): Payer: BLUE CROSS/BLUE SHIELD

## 2015-10-11 ENCOUNTER — Ambulatory Visit (HOSPITAL_COMMUNITY): Payer: BLUE CROSS/BLUE SHIELD

## 2015-10-18 ENCOUNTER — Ambulatory Visit (HOSPITAL_COMMUNITY)
Admission: RE | Admit: 2015-10-18 | Discharge: 2015-10-18 | Disposition: A | Discharge status: Home / Self Care | Payer: BLUE CROSS/BLUE SHIELD | Source: Ambulatory Visit | Attending: Gastroenterology | Admitting: Gastroenterology

## 2015-10-18 DIAGNOSIS — I700 Atherosclerosis of aorta: Secondary | ICD-10-CM | POA: Insufficient documentation

## 2015-10-18 DIAGNOSIS — R109 Unspecified abdominal pain: Secondary | ICD-10-CM | POA: Diagnosis present

## 2015-10-18 DIAGNOSIS — R1013 Epigastric pain: Secondary | ICD-10-CM

## 2015-10-18 LAB — POCT I-STAT CREATININE: CREATININE: 0.8 mg/dL (ref 0.61–1.24)

## 2015-10-18 MED ORDER — IOHEXOL 300 MG/ML  SOLN
125.0000 mL | Freq: Once | INTRAMUSCULAR | Status: AC | PRN
  Administered 2015-10-18: 120 mL via INTRAVENOUS

## 2015-10-20 NOTE — Progress Notes (Signed)
Quick Note:  No explanation for abdominal pain. No evidence of mesenteric ischemia. Moderate to large stool burden. This could be causing some issues. Make sure he is taking fiber daily. May need to add Miralax every other evening to every day. Let's have him return in 6 weeks. ______

## 2015-10-27 ENCOUNTER — Ambulatory Visit: Payer: BLUE CROSS/BLUE SHIELD | Admitting: Nutrition

## 2015-11-20 DIAGNOSIS — I10 Essential (primary) hypertension: Secondary | ICD-10-CM | POA: Diagnosis not present

## 2015-11-20 DIAGNOSIS — Z6838 Body mass index (BMI) 38.0-38.9, adult: Secondary | ICD-10-CM | POA: Diagnosis not present

## 2015-11-20 DIAGNOSIS — E1165 Type 2 diabetes mellitus with hyperglycemia: Secondary | ICD-10-CM | POA: Diagnosis not present

## 2015-11-20 DIAGNOSIS — Z0001 Encounter for general adult medical examination with abnormal findings: Secondary | ICD-10-CM | POA: Diagnosis not present

## 2015-11-20 DIAGNOSIS — E785 Hyperlipidemia, unspecified: Secondary | ICD-10-CM | POA: Diagnosis not present

## 2015-11-20 DIAGNOSIS — E669 Obesity, unspecified: Secondary | ICD-10-CM | POA: Diagnosis not present

## 2015-11-21 LAB — HEMOGLOBIN A1C: Hemoglobin A1C: 8.6

## 2015-11-28 ENCOUNTER — Encounter: Payer: Self-pay | Admitting: "Endocrinology

## 2015-11-28 ENCOUNTER — Ambulatory Visit (INDEPENDENT_AMBULATORY_CARE_PROVIDER_SITE_OTHER): Payer: BLUE CROSS/BLUE SHIELD | Admitting: "Endocrinology

## 2015-11-28 VITALS — BP 142/80 | HR 80 | Ht 71.0 in | Wt 249.0 lb

## 2015-11-28 DIAGNOSIS — E6609 Other obesity due to excess calories: Secondary | ICD-10-CM

## 2015-11-28 DIAGNOSIS — E118 Type 2 diabetes mellitus with unspecified complications: Secondary | ICD-10-CM | POA: Diagnosis not present

## 2015-11-28 DIAGNOSIS — Z91199 Patient's noncompliance with other medical treatment and regimen due to unspecified reason: Secondary | ICD-10-CM | POA: Insufficient documentation

## 2015-11-28 DIAGNOSIS — Z794 Long term (current) use of insulin: Secondary | ICD-10-CM | POA: Diagnosis not present

## 2015-11-28 DIAGNOSIS — Z9119 Patient's noncompliance with other medical treatment and regimen: Secondary | ICD-10-CM

## 2015-11-28 DIAGNOSIS — E1165 Type 2 diabetes mellitus with hyperglycemia: Secondary | ICD-10-CM

## 2015-11-28 DIAGNOSIS — I10 Essential (primary) hypertension: Secondary | ICD-10-CM

## 2015-11-28 DIAGNOSIS — IMO0002 Reserved for concepts with insufficient information to code with codable children: Secondary | ICD-10-CM

## 2015-11-28 MED ORDER — GLUCOSE BLOOD VI STRP: ORAL_STRIP | Status: DC

## 2015-11-28 MED ORDER — INSULIN LISPRO PROT & LISPRO (75-25 MIX) 100 UNIT/ML KWIKPEN: 30.0000 [IU] | PEN_INJECTOR | Freq: Two times a day (BID) | SUBCUTANEOUS | Status: DC

## 2015-11-28 MED ORDER — METFORMIN HCL 1000 MG PO TABS: 1000.0000 mg | ORAL_TABLET | Freq: Two times a day (BID) | ORAL | Status: DC

## 2015-11-28 MED ORDER — EMPAGLIFLOZIN 10 MG PO TABS: 10.0000 mg | ORAL_TABLET | Freq: Every day | ORAL | Status: DC

## 2015-11-28 NOTE — Patient Instructions (Signed)

## 2015-11-28 NOTE — Progress Notes (Signed)
Subjective:    Patient ID: Benjamin Flynn, male    DOB: 1950-12-01,    Past Medical History  Diagnosis Date  . HTN (hypertension)   . Diabetes ([place])   . GERD (gastroesophageal reflux disease)   . Arthritis   . Asthma    Past Surgical History  Procedure Laterality Date  . Colonoscopy  2002    Dr. Gala Romney. Normal.  . Colonoscopy with esophagogastroduodenoscopy (egd) N/A 02/10/2013    [id number] hiatal hernia. Minimally abnormal-appearing gastric mucosa-status post biopsy. PATH H. PYLORI GASTRITIS:Colonic polyp-TUBULAR ADENOMA   Social History   Social History  . Marital Status: Married    Spouse Name: N/A  . Number of Children: 2  . Years of Education: N/A   Occupational History  .     Social History Main Topics  . Smoking status: Never Smoker   . Smokeless tobacco: None     Comment: Never smoked  . Alcohol Use: No     Comment: former user  . Drug Use: No  . Sexual Activity: Yes    Birth Control/ Protection: None   Other Topics Concern  . None   Social History Narrative   Outpatient Encounter Prescriptions as of 11/28/2015  Medication Sig  . amLODipine (NORVASC) 5 MG tablet Take 5 mg by mouth daily.  Marland Kitchen CARAFATE 1 GM/10ML suspension TAKE  10 ML BY MOUTH 4 TIMES DAILY AS NEEDED  . empagliflozin (JARDIANCE) 10 MG TABS tablet Take 10 mg by mouth daily.  Marland Kitchen esomeprazole (NEXIUM) 40 MG capsule Take 1 capsule (40 mg total) by mouth every morning.  Marland Kitchen glucose blood (ONE TOUCH ULTRA TEST) test strip Use as instructed  To test glucose 2 times a day.  . Insulin Lispro Prot & Lispro (HUMALOG MIX 75/25 KWIKPEN) (75-25) 100 UNIT/ML Kwikpen Inject 30 Units into the skin 2 (two) times daily.  . Insulin Pen Needle (B-D ULTRAFINE III SHORT PEN) 31G X 8 MM MISC 1 each by Does not apply route as directed.  Marland Kitchen losartan (COZAAR) 25 MG tablet Take 25 mg by mouth daily. Patient unsure of dose.  . metFORMIN (GLUCOPHAGE) 1000 MG tablet Take 1 tablet (1,000 mg total) by mouth 2 (two) times daily  with a meal.  . NOVOFINE 30G X 8 MM MISC USE TWICE DAILY  . RABEprazole (ACIPHEX) 20 MG tablet Take 1 tablet (20 mg total) by mouth daily. 30 minutes before breakfast.  . tadalafil (CIALIS) 5 MG tablet Take 5 mg by mouth daily as needed for erectile dysfunction.  . [DISCONTINUED] glucose blood (ONE TOUCH ULTRA TEST) test strip Use as instructed  To test glucose 2 times a day.  . [DISCONTINUED] Insulin Lispro Prot & Lispro (HUMALOG MIX 75/25 KWIKPEN) (75-25) 100 UNIT/ML Kwikpen Inject 30 Units into the skin 2 (two) times daily.  . [DISCONTINUED] metFORMIN (GLUCOPHAGE) 1000 MG tablet Take 1 tablet (1,000 mg total) by mouth 2 (two) times daily with a meal.   No facility-administered encounter medications on file as of 11/28/2015.   ALLERGIES: No Known Allergies  Diabetes Benjamin Flynn presents for his follow-up diabetic visit. Benjamin Flynn has type 2 diabetes mellitus. His disease course has been improving. There are no hypoglycemic associated symptoms. Pertinent negatives for hypoglycemia include no confusion, headaches, pallor or seizures. Associated symptoms include polydipsia and polyuria. Pertinent negatives for diabetes include no chest pain, no fatigue, no polyphagia and no weakness. There are no hypoglycemic complications. Symptoms are improving. There are no diabetic complications. Risk factors for coronary artery disease include  dyslipidemia, diabetes mellitus, hypertension, male sex, obesity and sedentary lifestyle. Benjamin Flynn is compliant with treatment most of the time (Benjamin Flynn did not comply with insulin instructions. Benjamin Flynn did not monitor enough). His weight is stable. Benjamin Flynn rarely participates in exercise. Home blood sugar record trend: Benjamin Flynn did not bring the meter nor logs to review today. His overall blood glucose range is 140-180 mg/dl. An ACE inhibitor/angiotensin II receptor blocker is being taken.  Hypertension This is a chronic problem. The current episode started more than 1 year ago. Pertinent negatives include no chest  pain, headaches, neck pain, palpitations or shortness of breath. Past treatments include alpha 1 blockers.  Hyperlipidemia This is a chronic problem. The current episode started more than 1 year ago. Pertinent negatives include no chest pain, myalgias or shortness of breath.     Review of Systems  Constitutional: Negative for fatigue and unexpected weight change.  HENT: Negative for dental problem, mouth sores and trouble swallowing.   Eyes: Negative for visual disturbance.  Respiratory: Negative for cough, choking, chest tightness, shortness of breath and wheezing.   Cardiovascular: Negative for chest pain, palpitations and leg swelling.  Gastrointestinal: Negative for nausea, vomiting, abdominal pain, diarrhea, constipation and abdominal distention.  Endocrine: Positive for polydipsia and polyuria. Negative for polyphagia.  Genitourinary: Negative for dysuria, urgency, hematuria and flank pain.  Musculoskeletal: Negative for myalgias, back pain, gait problem and neck pain.  Skin: Negative for pallor, rash and wound.  Neurological: Negative for seizures, syncope, weakness, numbness and headaches.  Psychiatric/Behavioral: Negative.  Negative for confusion and dysphoric mood.    Objective:    BP 142/80 mmHg  Pulse 80  Ht 5\' 11"  (1.803 m)  Wt 249 lb (112.946 kg)  BMI 34.74 kg/m2  SpO2 96%  Wt Readings from Last 3 Encounters:  11/28/15 249 lb (112.946 kg)  09/12/15 246 lb (111.585 kg)  09/07/15 243 lb (110.224 kg)    Physical Exam  Constitutional: Benjamin Flynn is oriented to person, place, and time. Benjamin Flynn appears well-developed and well-nourished. Benjamin Flynn is cooperative. No distress.  HENT:  Head: Normocephalic and atraumatic.  Eyes: EOM are normal.  Neck: Normal range of motion. Neck supple. No tracheal deviation present. No thyromegaly present.  Cardiovascular: Normal rate, S1 normal, S2 normal and normal heart sounds.  Exam reveals no gallop.   No murmur heard. Pulses:      Dorsalis pedis  pulses are 1+ on the right side, and 1+ on the left side.       Posterior tibial pulses are 1+ on the right side, and 1+ on the left side.  Pulmonary/Chest: Breath sounds normal. No respiratory distress. Benjamin Flynn has no wheezes.  Abdominal: Soft. Bowel sounds are normal. Benjamin Flynn exhibits no distension. There is no tenderness. There is no guarding and no CVA tenderness.  Musculoskeletal: Benjamin Flynn exhibits no edema.       Right shoulder: Benjamin Flynn exhibits no swelling and no deformity.  Neurological: Benjamin Flynn is alert and oriented to person, place, and time. Benjamin Flynn has normal strength and normal reflexes. No cranial nerve deficit or sensory deficit. Gait normal.  Skin: Skin is warm and dry. No rash noted. No cyanosis. Nails show no clubbing.  Psychiatric: Benjamin Flynn has a normal mood and affect. His speech is normal and behavior is normal. Judgment and thought content normal. Cognition and memory are normal.    Results for orders placed or performed in visit on 11/28/15  Hemoglobin A1c  Result Value Ref Range   Hemoglobin A1C 8.6    Complete Blood  Count (Most recent): Lab Results  Component Value Date   WBC 3.8* 03/24/2015   HGB 13.5 03/24/2015   HCT 40.2 03/24/2015   MCV 86.6 03/24/2015   PLT 223 03/24/2015   Chemistry (most recent): Lab Results  Component Value Date   NA 138 08/16/2015   K 3.7 08/16/2015   CL 104 08/16/2015   CO2 26 08/16/2015   BUN 18 08/16/2015   CREATININE 0.80 10/18/2015   Diabetic Labs (most recent): Lab Results  Component Value Date   HGBA1C 8.6 11/21/2015   HGBA1C 9.5* 08/16/2015   Lipid profile (most recent): No results found for: TRIG, CHOL       Assessment & Plan:   1. Uncontrolled type 2 diabetes mellitus without complication, with long-term current use of insulin ([place])  Patient came with slightly improving A1c of 8.6% from 9.5%. However Benjamin Flynn covered only one meal with insulin, did not bring his logs.  - Patient remains at a high risk for more acute and chronic complications of  diabetes which include CAD, CVA, CKD, retinopathy, and neuropathy. These are all discussed in detail with the patient.  - I have re-counseled the patient on diet management and  Weight loss  by adopting a carbohydrate restricted / protein rich  Diet. - Patient is advised to stick to a routine mealtimes to eat 3 meals  a day and avoid unnecessary snacks ( to snack only to correct hypoglycemia).  - Suggestion is made for patient to avoid simple carbohydrates   from their diet including Cakes , Desserts, Ice Cream,  Soda (  diet and regular) , Sweet Tea , Candies,  Chips, Cookies, Artificial Sweeteners,   and "Sugar-free" Products .  This will help patient to have stable blood glucose profile and potentially avoid unintended  Weight gain.  - The patient has been  scheduled with Jearld Fenton, RDN, CDE for individualized DM education. - I have approached patient with the following individualized plan to manage diabetes and patient agrees.  -I advised him to resume monitoring BG AC x 3 . I will continue Humalog 75/25 30 units with breakfast and supper for premeal readings of >90.  - Benjamin Flynn may need basal/bolus insulin based on his readings and his commitments.  - Benjamin Flynn will continue on Metformin 1gm bid, therapeutically suitable for patient. -I have contacted Jardiance 10mg  by mouth every morning. Side effects and precautions discussed with him. - Patient is warned not to take insulin without proper monitoring per orders. -Adjustment parameters are given for hypo and hyperglycemia in writing. -Patient is encouraged to call clinic for blood glucose levels less than 70 or above 300 mg /dl.   - Patient specific target  for A1c; LDL, HDL, Triglycerides, and  Waist Circumference were discussed in detail.  2) BP/HTN: Controlled. Continue current medications including  ARB. 3) HPL- Continue Welchol. 4)  Weight/Diet: CDE consult in progress, exercise, and carbohydrates information provided.  5) Chronic  Care/Health Maintenance:  -Patient ARB  and encouraged to continue to follow up with Ophthalmology, Podiatrist at least yearly or according to recommendations, and advised to stay away from smoking. I have recommended yearly flu vaccine and pneumonia vaccination at least every 5 years; moderate intensity exercise for up to 150 minutes weekly; and  sleep for at least 7 hours a day.  I advised patient to maintain close follow up with their PCP for primary care needs.  Patient is asked to bring meter and  blood glucose logs during their next visit.  Follow up plan: Return in about 3 months (around 02/28/2016) for diabetes, high blood pressure, high cholesterol, follow up with meter and logs- no labs.  Glade Lloyd, MD Phone: [phone]  Fax: [phone]   11/28/2015, 3:08 PM

## 2015-12-04 DIAGNOSIS — E1165 Type 2 diabetes mellitus with hyperglycemia: Secondary | ICD-10-CM | POA: Diagnosis not present

## 2015-12-04 DIAGNOSIS — I10 Essential (primary) hypertension: Secondary | ICD-10-CM | POA: Diagnosis not present

## 2015-12-04 DIAGNOSIS — E785 Hyperlipidemia, unspecified: Secondary | ICD-10-CM | POA: Diagnosis not present

## 2015-12-08 ENCOUNTER — Ambulatory Visit: Payer: BLUE CROSS/BLUE SHIELD | Admitting: Gastroenterology

## 2015-12-08 ENCOUNTER — Encounter: Payer: Self-pay | Admitting: Gastroenterology

## 2015-12-08 ENCOUNTER — Telehealth: Payer: Self-pay | Admitting: Gastroenterology

## 2015-12-08 NOTE — Telephone Encounter (Signed)
Patient was a no show and letter sent  °

## 2015-12-14 ENCOUNTER — Encounter: Payer: Self-pay | Admitting: "Endocrinology

## 2015-12-20 DIAGNOSIS — E1165 Type 2 diabetes mellitus with hyperglycemia: Secondary | ICD-10-CM | POA: Diagnosis not present

## 2015-12-20 DIAGNOSIS — J3089 Other allergic rhinitis: Secondary | ICD-10-CM | POA: Diagnosis not present

## 2015-12-20 DIAGNOSIS — M549 Dorsalgia, unspecified: Secondary | ICD-10-CM | POA: Diagnosis not present

## 2016-02-28 ENCOUNTER — Ambulatory Visit: Payer: BLUE CROSS/BLUE SHIELD | Admitting: "Endocrinology

## 2016-03-08 ENCOUNTER — Other Ambulatory Visit: Payer: Self-pay | Admitting: "Endocrinology

## 2016-03-18 DIAGNOSIS — E1165 Type 2 diabetes mellitus with hyperglycemia: Secondary | ICD-10-CM | POA: Diagnosis not present

## 2016-03-18 DIAGNOSIS — E785 Hyperlipidemia, unspecified: Secondary | ICD-10-CM | POA: Diagnosis not present

## 2016-03-18 DIAGNOSIS — I10 Essential (primary) hypertension: Secondary | ICD-10-CM | POA: Diagnosis not present

## 2016-03-21 ENCOUNTER — Other Ambulatory Visit: Payer: Self-pay | Admitting: "Endocrinology

## 2016-03-22 IMAGING — NM NM HEPATO W/GB/PHARM/[PERSON_NAME]
2 series · 12 of 12 positions shown · non-contrast
Comparison: Ultrasound 03/10/2015.

CLINICAL DATA: Reflux.  Nausea.

EXAM:
NUCLEAR MEDICINE HEPATOBILIARY IMAGING WITH GALLBLADDER EF
TECHNIQUE: Sequential images of the abdomen were obtained [DATE] minutes
following intravenous administration of radiopharmaceutical. After
slow intravenous infusion of 2.2 micrograms Cholecystokinin,
gallbladder ejection fraction was determined.
RADIOPHARMACEUTICALS:  5.3 mCi Fc-QQm Choletec IV

[Series 1: biliary · 3.25mm/px · 6 of 60 frames shown]
[frame 6/60]
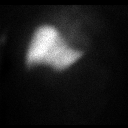
[frame 16/60]
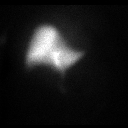
[frame 26/60]
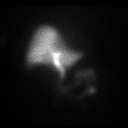
[frame 36/60]
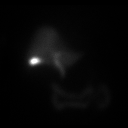
[frame 46/60]
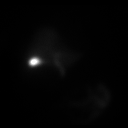
[frame 56/60]
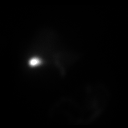

[Series 2: gbef · 3.25mm/px · 6 of 60 frames shown]
[frame 6/60]
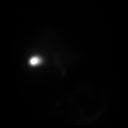
[frame 16/60]
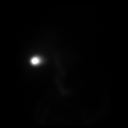
[frame 26/60]
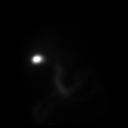
[frame 36/60]
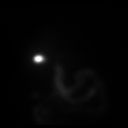
[frame 46/60]
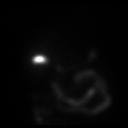
[frame 56/60]
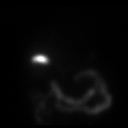

[12 of 12 positions shown; findings below may reference images not displayed]

FINDINGS: Liver, biliary system, gallbladder, bowel visualize normally.
Gallbladder ejection fraction at 45 min is 66%.. At 45 min, normal
ejection fraction is greater than 40%.
IMPRESSION: Normal exam.

## 2016-03-29 ENCOUNTER — Ambulatory Visit (INDEPENDENT_AMBULATORY_CARE_PROVIDER_SITE_OTHER): Payer: BLUE CROSS/BLUE SHIELD | Admitting: Urology

## 2016-03-29 DIAGNOSIS — R350 Frequency of micturition: Secondary | ICD-10-CM

## 2016-03-29 DIAGNOSIS — N401 Enlarged prostate with lower urinary tract symptoms: Secondary | ICD-10-CM | POA: Diagnosis not present

## 2016-03-29 DIAGNOSIS — N5201 Erectile dysfunction due to arterial insufficiency: Secondary | ICD-10-CM

## 2016-03-29 DIAGNOSIS — R81 Glycosuria: Secondary | ICD-10-CM

## 2016-03-29 DIAGNOSIS — R972 Elevated prostate specific antigen [PSA]: Secondary | ICD-10-CM | POA: Diagnosis not present

## 2016-05-20 ENCOUNTER — Other Ambulatory Visit: Payer: Self-pay | Admitting: "Endocrinology

## 2016-05-20 DIAGNOSIS — R972 Elevated prostate specific antigen [PSA]: Secondary | ICD-10-CM | POA: Diagnosis not present

## 2016-05-20 DIAGNOSIS — E118 Type 2 diabetes mellitus with unspecified complications: Secondary | ICD-10-CM | POA: Diagnosis not present

## 2016-05-20 DIAGNOSIS — Z794 Long term (current) use of insulin: Secondary | ICD-10-CM | POA: Diagnosis not present

## 2016-05-20 DIAGNOSIS — E1165 Type 2 diabetes mellitus with hyperglycemia: Secondary | ICD-10-CM | POA: Diagnosis not present

## 2016-05-20 LAB — COMPLETE METABOLIC PANEL WITH GFR
ALT: 11 U/L (ref 9–46)
AST: 11 U/L (ref 10–35)
Albumin: 3.9 g/dL (ref 3.6–5.1)
Alkaline Phosphatase: 55 U/L (ref 40–115)
BUN: 15 mg/dL (ref 7–25)
CHLORIDE: 105 mmol/L (ref 98–110)
CO2: 26 mmol/L (ref 20–31)
CREATININE: 0.89 mg/dL (ref 0.70–1.25)
Calcium: 9.1 mg/dL (ref 8.6–10.3)
GFR, Est African American: >89 mL/min (ref >=60)
GFR, Est Non African American: 89 mL/min (ref >=60)
Glucose, Bld: 221 mg/dL — ABNORMAL HIGH (ref 65–99)
Potassium: 4.2 mmol/L (ref 3.5–5.3)
SODIUM: 138 mmol/L (ref 135–146)
Total Bilirubin: 0.6 mg/dL (ref 0.2–1.2)
Total Protein: 6.1 g/dL (ref 6.1–8.1)

## 2016-05-21 LAB — HEMOGLOBIN A1C
Hgb A1c MFr Bld: 9.3 % — ABNORMAL HIGH (ref <5.7)
Mean Plasma Glucose: 220 mg/dL

## 2016-05-27 ENCOUNTER — Other Ambulatory Visit: Payer: Self-pay | Admitting: "Endocrinology

## 2016-05-27 ENCOUNTER — Ambulatory Visit: Payer: BLUE CROSS/BLUE SHIELD | Admitting: "Endocrinology

## 2016-05-27 ENCOUNTER — Other Ambulatory Visit: Payer: Self-pay | Admitting: Gastroenterology

## 2016-05-29 ENCOUNTER — Other Ambulatory Visit: Payer: Self-pay

## 2016-05-29 MED ORDER — ESOMEPRAZOLE MAGNESIUM 40 MG PO CPDR
40.0000 mg | DELAYED_RELEASE_CAPSULE | ORAL | 3 refills | Status: DC
Start: 2016-05-29 — End: 2017-05-31

## 2016-05-31 ENCOUNTER — Telehealth: Payer: Self-pay

## 2016-05-31 ENCOUNTER — Other Ambulatory Visit: Payer: Self-pay | Admitting: "Endocrinology

## 2016-05-31 NOTE — Telephone Encounter (Signed)
Left message for pt to call us back. 

## 2016-05-31 NOTE — Telephone Encounter (Signed)
He needs to be seen with repeat labs, no refills.

## 2016-05-31 NOTE — Telephone Encounter (Signed)
Pt is requesting a refill on Humalog. He no showed his appt on 05-27-16 & has not been seen since may 2017

## 2016-06-03 ENCOUNTER — Ambulatory Visit (INDEPENDENT_AMBULATORY_CARE_PROVIDER_SITE_OTHER): Payer: BLUE CROSS/BLUE SHIELD | Admitting: "Endocrinology

## 2016-06-03 ENCOUNTER — Encounter: Payer: Self-pay | Admitting: "Endocrinology

## 2016-06-03 VITALS — BP 151/75 | HR 74 | Ht 71.0 in | Wt 238.0 lb

## 2016-06-03 DIAGNOSIS — Z9119 Patient's noncompliance with other medical treatment and regimen: Secondary | ICD-10-CM

## 2016-06-03 DIAGNOSIS — E1165 Type 2 diabetes mellitus with hyperglycemia: Secondary | ICD-10-CM

## 2016-06-03 DIAGNOSIS — Z91199 Patient's noncompliance with other medical treatment and regimen due to unspecified reason: Secondary | ICD-10-CM

## 2016-06-03 DIAGNOSIS — Z794 Long term (current) use of insulin: Secondary | ICD-10-CM | POA: Diagnosis not present

## 2016-06-03 DIAGNOSIS — I10 Essential (primary) hypertension: Secondary | ICD-10-CM

## 2016-06-03 DIAGNOSIS — E118 Type 2 diabetes mellitus with unspecified complications: Secondary | ICD-10-CM

## 2016-06-03 DIAGNOSIS — IMO0002 Reserved for concepts with insufficient information to code with codable children: Secondary | ICD-10-CM

## 2016-06-03 NOTE — Telephone Encounter (Signed)
Pt comes in today for OV

## 2016-06-03 NOTE — Progress Notes (Signed)
Subjective:    Patient ID: Benjamin Flynn, male    DOB: 1951/02/16,    Past Medical History:  Diagnosis Date  . Arthritis   . Asthma   . Diabetes ([place])   . GERD (gastroesophageal reflux disease)   . HTN (hypertension)    Past Surgical History:  Procedure Laterality Date  . COLONOSCOPY  2002   Dr. Gala Romney. Normal.  . COLONOSCOPY WITH ESOPHAGOGASTRODUODENOSCOPY (EGD) N/A 02/10/2013   [id number] hiatal hernia. Minimally abnormal-appearing gastric mucosa-status post biopsy. PATH H. PYLORI GASTRITIS:Colonic polyp-TUBULAR ADENOMA   Social History   Social History  . Marital status: Married    Spouse name: N/A  . Number of children: 2  . Years of education: N/A   Occupational History  .  [employer]   Social History Main Topics  . Smoking status: Never Smoker  . Smokeless tobacco: Never Used     Comment: Never smoked  . Alcohol use No     Comment: former user  . Drug use: No  . Sexual activity: Yes    Birth control/ protection: None   Other Topics Concern  . None   Social History Narrative  . None   Outpatient Encounter Prescriptions as of 06/03/2016  Medication Sig  . metFORMIN (GLUCOPHAGE) 1000 MG tablet Take 1,000 mg by mouth 2 (two) times daily with a meal.  . amLODipine (NORVASC) 5 MG tablet Take 5 mg by mouth daily.  Marland Kitchen CARAFATE 1 GM/10ML suspension TAKE  10 ML BY MOUTH 4 TIMES DAILY AS NEEDED  . esomeprazole (NEXIUM) 40 MG capsule Take 1 capsule (40 mg total) by mouth every morning.  Marland Kitchen glucose blood (ONE TOUCH ULTRA TEST) test strip Use as instructed  To test glucose 2 times a day.  Marland Kitchen HUMALOG MIX 75/25 KWIKPEN (75-25) 100 UNIT/ML Kwikpen INJECT 30 UNITS SUBCUTANEOUSLY TWICE DAILY.  Marland Kitchen LITETOUCH PEN NEEDLES 31G X 8 MM MISC USE TO INJECT INSULIN TWICE DAILY  . losartan (COZAAR) 25 MG tablet Take 25 mg by mouth daily. Patient unsure of dose.  . metFORMIN (GLUCOPHAGE) 1000 MG tablet Take 1 tablet (1,000 mg total) by mouth 2 (two) times daily with a meal.  .  NOVOFINE 30G X 8 MM MISC USE TWICE DAILY  . RABEprazole (ACIPHEX) 20 MG tablet Take 1 tablet (20 mg total) by mouth daily. 30 minutes before breakfast.  . tadalafil (CIALIS) 5 MG tablet Take 5 mg by mouth daily as needed for erectile dysfunction.  . [DISCONTINUED] empagliflozin (JARDIANCE) 10 MG TABS tablet Take 10 mg by mouth daily.  . [DISCONTINUED] metFORMIN (GLUCOPHAGE) 1000 MG tablet TAKE ONE TABLET BY MOUTH TWICE DAILY WITH A MEAL   No facility-administered encounter medications on file as of 06/03/2016.    ALLERGIES: No Known Allergies  Diabetes  He presents for his follow-up diabetic visit. He has type 2 diabetes mellitus. His disease course has been improving. There are no hypoglycemic associated symptoms. Pertinent negatives for hypoglycemia include no confusion, headaches, pallor or seizures. Associated symptoms include polydipsia and polyuria. Pertinent negatives for diabetes include no chest pain, no fatigue, no polyphagia and no weakness. There are no hypoglycemic complications. Symptoms are improving. There are no diabetic complications. Risk factors for coronary artery disease include dyslipidemia, diabetes mellitus, hypertension, male sex, obesity and sedentary lifestyle. He is compliant with treatment most of the time (He did not comply with insulin instructions. He did not monitor enough). His weight is stable. He rarely participates in exercise. Home blood sugar record trend:  He did not bring the meter nor logs to review today. His overall blood glucose range is 140-180 mg/dl. An ACE inhibitor/angiotensin II receptor blocker is being taken.  Hypertension  This is a chronic problem. The current episode started more than 1 year ago. Pertinent negatives include no chest pain, headaches, neck pain, palpitations or shortness of breath. Past treatments include alpha 1 blockers.  Hyperlipidemia  This is a chronic problem. The current episode started more than 1 year ago. Pertinent  negatives include no chest pain, myalgias or shortness of breath.     Review of Systems  Constitutional: Negative for fatigue and unexpected weight change.  HENT: Negative for dental problem, mouth sores and trouble swallowing.   Eyes: Negative for visual disturbance.  Respiratory: Negative for cough, choking, chest tightness, shortness of breath and wheezing.   Cardiovascular: Negative for chest pain, palpitations and leg swelling.  Gastrointestinal: Negative for abdominal distention, abdominal pain, constipation, diarrhea, nausea and vomiting.  Endocrine: Positive for polydipsia and polyuria. Negative for polyphagia.  Genitourinary: Negative for dysuria, flank pain, hematuria and urgency.  Musculoskeletal: Negative for back pain, gait problem, myalgias and neck pain.  Skin: Negative for pallor, rash and wound.  Neurological: Negative for seizures, syncope, weakness, numbness and headaches.  Psychiatric/Behavioral: Negative.  Negative for confusion and dysphoric mood.    Objective:    BP (!) 151/75   Pulse 74   Ht 5\' 11"  (1.803 m)   Wt 238 lb (108 kg)   BMI 33.19 kg/m   Wt Readings from Last 3 Encounters:  06/03/16 238 lb (108 kg)  11/28/15 249 lb (112.9 kg)  09/12/15 246 lb (111.6 kg)    Physical Exam  Constitutional: He is oriented to person, place, and time. He appears well-developed and well-nourished. He is cooperative. No distress.  HENT:  Head: Normocephalic and atraumatic.  Eyes: EOM are normal.  Neck: Normal range of motion. Neck supple. No tracheal deviation present. No thyromegaly present.  Cardiovascular: Normal rate, S1 normal, S2 normal and normal heart sounds.  Exam reveals no gallop.   No murmur heard. Pulses:      Dorsalis pedis pulses are 1+ on the right side, and 1+ on the left side.       Posterior tibial pulses are 1+ on the right side, and 1+ on the left side.  Pulmonary/Chest: Breath sounds normal. No respiratory distress. He has no wheezes.   Abdominal: Soft. Bowel sounds are normal. He exhibits no distension. There is no tenderness. There is no guarding and no CVA tenderness.  Musculoskeletal: He exhibits no edema.       Right shoulder: He exhibits no swelling and no deformity.  Neurological: He is alert and oriented to person, place, and time. He has normal strength and normal reflexes. No cranial nerve deficit or sensory deficit. Gait normal.  Skin: Skin is warm and dry. No rash noted. No cyanosis. Nails show no clubbing.  Psychiatric: He has a normal mood and affect. His speech is normal and behavior is normal. Judgment and thought content normal. Cognition and memory are normal.    Results for orders placed or performed in visit on 05/20/16  COMPLETE METABOLIC PANEL WITH GFR  Result Value Ref Range   Sodium 138 135 - 146 mmol/L   Potassium 4.2 3.5 - 5.3 mmol/L   Chloride 105 98 - 110 mmol/L   CO2 26 20 - 31 mmol/L   Glucose, Bld 221 (H) 65 - 99 mg/dL   BUN 15 7 -  25 mg/dL   Creat 0.89 0.70 - 1.25 mg/dL   Total Bilirubin 0.6 0.2 - 1.2 mg/dL   Alkaline Phosphatase 55 40 - 115 U/L   AST 11 10 - 35 U/L   ALT 11 9 - 46 U/L   Total Protein 6.1 6.1 - 8.1 g/dL   Albumin 3.9 3.6 - 5.1 g/dL   Calcium 9.1 8.6 - 10.3 mg/dL   GFR, Est African American >89 >=60 mL/min   GFR, Est Non African American 89 >=60 mL/min  Hemoglobin A1c  Result Value Ref Range   Hgb A1c MFr Bld 9.3 (H) <5.7 %   Mean Plasma Glucose 220 mg/dL   Complete Blood Count (Most recent): Lab Results  Component Value Date   WBC 3.8 (L) 03/24/2015   HGB 13.5 03/24/2015   HCT 40.2 03/24/2015   MCV 86.6 03/24/2015   PLT 223 03/24/2015   Chemistry (most recent): Lab Results  Component Value Date   NA 138 05/20/2016   K 4.2 05/20/2016   CL 105 05/20/2016   CO2 26 05/20/2016   BUN 15 05/20/2016   CREATININE 0.89 05/20/2016   Diabetic Labs (most recent): Lab Results  Component Value Date   HGBA1C 9.3 (H) 05/20/2016   HGBA1C 8.6 11/21/2015   HGBA1C  9.5 (H) 08/16/2015   Lipid profile (most recent): No results found for: TRIG, CHOL       Assessment & Plan:   1. Uncontrolled type 2 diabetes mellitus without complication, with long-term current use of insulin ([place])  Patient came with Loss of control of glycemia with A1c rising to 9.3% from 8.6%. - Unfortunately patient did not stay on therapeutic regimen prescribed for him last visit in May and he missed his appointments since then.  - His meter shows 10 readings in the last 30 days, considered significantly inadequate commitment for monitoring - Patient remains at a high risk for more acute and chronic complications of diabetes which include CAD, CVA, CKD, retinopathy, and neuropathy. These are all discussed in detail with the patient.  - I have re-counseled the patient on diet management and  Weight loss  by adopting a carbohydrate restricted / protein rich  Diet. - Patient is advised to stick to a routine mealtimes to eat 3 meals  a day and avoid unnecessary snacks ( to snack only to correct hypoglycemia).  - Suggestion is made for patient to avoid simple carbohydrates   from their diet including Cakes , Desserts, Ice Cream,  Soda (  diet and regular) , Sweet Tea , Candies,  Chips, Cookies, Artificial Sweeteners,   and "Sugar-free" Products .  This will help patient to have stable blood glucose profile and potentially avoid unintended  Weight gain.  - The patient has been  scheduled with Jearld Fenton, RDN, CDE for individualized DM education. - I have approached patient with the following individualized plan to manage diabetes and patient agrees.  - Mr. Dirienzo remains alarmingly noncompliant and reverses to nonadherence once again.  - I advised him to resume monitoring for meals and at bedtime . - He will return in one week with his meter and logs. -  In the meantime, I  Advised him to continue Humalog 75/25 30 units with breakfast and  30 units with supper for premeal readings of  >90.  - he may need basal/bolus insulin based on his readings and his commitments.  - Patient is warned not to take insulin without proper monitoring per orders. -Adjustment parameters are given for  hypo and hyperglycemia in writing. -Patient is encouraged to call clinic for blood glucose levels less than 70 or above 300 mg /dl. - he will continue on Metformin 1gm bid, therapeutically suitable for patient. -  He stopped his Jardiance  on his own for urinary frequency. I attempted to explain to him that urinary frequency is likely due to his hyperglycemia, however he decided to stay off of Jardiance .  - Patient specific target  for A1c; LDL, HDL, Triglycerides, and  Waist Circumference were discussed in detail.  2) BP/HTN: uncontrolled. Continue current medications including  ARB. 3) HPL- Continue Welchol. 4)  Weight/Diet: CDE consult in progress, exercise, and carbohydrates information provided.  5) Chronic Care/Health Maintenance:  -Patient ARB  and encouraged to continue to follow up with Ophthalmology, Podiatrist at least yearly or according to recommendations, and advised to stay away from smoking. I have recommended yearly flu vaccine and pneumonia vaccination at least every 5 years; moderate intensity exercise for up to 150 minutes weekly; and  sleep for at least 7 hours a day.  I advised patient to maintain close follow up with their PCP for primary care needs.  Patient is asked to bring meter and  blood glucose logs during their next visit.   Follow up plan: Return in about 1 week (around 06/10/2016) for follow up with meter and logs- no labs.  Glade Lloyd, MD Phone: [phone]  Fax: [phone]   06/03/2016, 11:37 AM

## 2016-06-10 ENCOUNTER — Encounter: Payer: Self-pay | Admitting: "Endocrinology

## 2016-06-10 ENCOUNTER — Ambulatory Visit (INDEPENDENT_AMBULATORY_CARE_PROVIDER_SITE_OTHER): Payer: BLUE CROSS/BLUE SHIELD | Admitting: "Endocrinology

## 2016-06-10 VITALS — BP 148/89 | HR 84 | Ht 71.0 in | Wt 237.0 lb

## 2016-06-10 DIAGNOSIS — I10 Essential (primary) hypertension: Secondary | ICD-10-CM

## 2016-06-10 DIAGNOSIS — Z794 Long term (current) use of insulin: Secondary | ICD-10-CM

## 2016-06-10 DIAGNOSIS — E1165 Type 2 diabetes mellitus with hyperglycemia: Secondary | ICD-10-CM

## 2016-06-10 DIAGNOSIS — Z91199 Patient's noncompliance with other medical treatment and regimen due to unspecified reason: Secondary | ICD-10-CM

## 2016-06-10 DIAGNOSIS — Z9119 Patient's noncompliance with other medical treatment and regimen: Secondary | ICD-10-CM

## 2016-06-10 DIAGNOSIS — E118 Type 2 diabetes mellitus with unspecified complications: Secondary | ICD-10-CM | POA: Diagnosis not present

## 2016-06-10 DIAGNOSIS — IMO0002 Reserved for concepts with insufficient information to code with codable children: Secondary | ICD-10-CM

## 2016-06-10 NOTE — Progress Notes (Signed)
Subjective:    Patient ID: Benjamin Flynn, male    DOB: 01-12-1951,    Past Medical History:  Diagnosis Date  . Arthritis   . Asthma   . Diabetes (Benjamin Flynn)   . GERD (gastroesophageal reflux disease)   . HTN (hypertension)    Past Surgical History:  Procedure Laterality Date  . COLONOSCOPY  2002   Dr. Gala Romney. Normal.  . COLONOSCOPY WITH ESOPHAGOGASTRODUODENOSCOPY (EGD) N/A 02/10/2013   [id number] hiatal hernia. Minimally abnormal-appearing gastric mucosa-status post biopsy. PATH H. PYLORI GASTRITIS:Colonic polyp-TUBULAR ADENOMA   Social History   Social History  . Marital status: Married    Spouse name: N/A  . Number of children: 2  . Years of education: N/A   Occupational History  .  Ben[employer]   Social History Main Topics  . Smoking status: Never Smoker  . Smokeless tobacco: Never Used     Comment: Never smoked  . Alcohol use No     Comment: former user  . Drug use: No  . Sexual activity: Yes    Birth control/ protection: None   Other Topics Concern  . None   Social History Narrative  . None   Outpatient Encounter Prescriptions as of 06/10/2016  Medication Sig  . amLODipine (NORVASC) 5 MG tablet Take 5 mg by mouth daily.  Marland Kitchen CARAFATE 1 GM/10ML suspension TAKE  10 ML BY MOUTH 4 TIMES DAILY AS NEEDED  . esomeprazole (NEXIUM) 40 MG capsule Take 1 capsule (40 mg total) by mouth every morning.  Marland Kitchen glucose blood (ONE TOUCH ULTRA TEST) test strip Use as instructed  To test glucose 2 times a day.  Marland Kitchen HUMALOG MIX 75/25 KWIKPEN (75-25) 100 UNIT/ML Kwikpen INJECT 30 UNITS SUBCUTANEOUSLY TWICE DAILY.  Marland Kitchen LITETOUCH PEN NEEDLES 31G X 8 MM MISC USE TO INJECT INSULIN TWICE DAILY  . losartan (COZAAR) 25 MG tablet Take 25 mg by mouth daily. Patient unsure of dose.  . metFORMIN (GLUCOPHAGE) 1000 MG tablet Take 1 tablet (1,000 mg total) by mouth 2 (two) times daily with a meal.  . NOVOFINE 30G X 8 MM MISC USE TWICE DAILY  . RABEprazole (ACIPHEX) 20 MG tablet Take 1 tablet (20  mg total) by mouth daily. 30 minutes before breakfast.  . tadalafil (CIALIS) 5 MG tablet Take 5 mg by mouth daily as needed for erectile dysfunction.  . [DISCONTINUED] metFORMIN (GLUCOPHAGE) 1000 MG tablet Take 1,000 mg by mouth 2 (two) times daily with a meal.   No facility-administered encounter medications on file as of 06/10/2016.    ALLERGIES: No Known Allergies  Diabetes  He presents for his follow-up diabetic visit. He has type 2 diabetes mellitus. His disease course has been improving. There are no hypoglycemic associated symptoms. Pertinent negatives for hypoglycemia include no confusion, headaches, pallor or seizures. Associated symptoms include polydipsia and polyuria. Pertinent negatives for diabetes include no chest pain, no fatigue, no polyphagia and no weakness. There are no hypoglycemic complications. Symptoms are improving. There are no diabetic complications. Risk factors for coronary artery disease include dyslipidemia, diabetes mellitus, hypertension, male sex, obesity and sedentary lifestyle. He is compliant with treatment most of the time (He did not comply with insulin instructions. He did not monitor enough). His weight is stable. He rarely participates in exercise. Home blood sugar record trend: He did not bring the meter nor logs to review today. His overall blood glucose range is 140-180 mg/dl. An ACE inhibitor/angiotensin II receptor blocker is being taken.  Hypertension  This  is a chronic problem. The current episode started more than 1 year ago. Pertinent negatives include no chest pain, headaches, neck pain, palpitations or shortness of breath. Past treatments include alpha 1 blockers.  Hyperlipidemia  This is a chronic problem. The current episode started more than 1 year ago. Pertinent negatives include no chest pain, myalgias or shortness of breath.     Review of Systems  Constitutional: Negative for fatigue and unexpected weight change.  HENT: Negative for  dental problem, mouth sores and trouble swallowing.   Eyes: Negative for visual disturbance.  Respiratory: Negative for cough, choking, chest tightness, shortness of breath and wheezing.   Cardiovascular: Negative for chest pain, palpitations and leg swelling.  Gastrointestinal: Negative for abdominal distention, abdominal pain, constipation, diarrhea, nausea and vomiting.  Endocrine: Positive for polydipsia and polyuria. Negative for polyphagia.  Genitourinary: Negative for dysuria, flank pain, hematuria and urgency.  Musculoskeletal: Negative for back pain, gait problem, myalgias and neck pain.  Skin: Negative for pallor, rash and wound.  Neurological: Negative for seizures, syncope, weakness, numbness and headaches.  Psychiatric/Behavioral: Negative.  Negative for confusion and dysphoric mood.    Objective:    BP (!) 148/89   Pulse 84   Ht 5\' 11"  (1.803 m)   Wt 237 lb (107.5 kg)   BMI 33.05 kg/m   Wt Readings from Last 3 Encounters:  06/10/16 237 lb (107.5 kg)  06/03/16 238 lb (108 kg)  11/28/15 249 lb (112.9 kg)    Physical Exam  Constitutional: He is oriented to person, place, and time. He appears well-developed and well-nourished. He is cooperative. No distress.  HENT:  Head: Normocephalic and atraumatic.  Eyes: EOM are normal.  Neck: Normal range of motion. Neck supple. No tracheal deviation present. No thyromegaly present.  Cardiovascular: Normal rate, S1 normal, S2 normal and normal heart sounds.  Exam reveals no gallop.   No murmur heard. Pulses:      Dorsalis pedis pulses are 1+ on the right side, and 1+ on the left side.       Posterior tibial pulses are 1+ on the right side, and 1+ on the left side.  Pulmonary/Chest: Breath sounds normal. No respiratory distress. He has no wheezes.  Abdominal: Soft. Bowel sounds are normal. He exhibits no distension. There is no tenderness. There is no guarding and no CVA tenderness.  Musculoskeletal: He exhibits no edema.        Right shoulder: He exhibits no swelling and no deformity.  Neurological: He is alert and oriented to person, place, and time. He has normal strength and normal reflexes. No cranial nerve deficit or sensory deficit. Gait normal.  Skin: Skin is warm and dry. No rash noted. No cyanosis. Nails show no clubbing.  Psychiatric: He has a normal mood and affect. His speech is normal and behavior is normal. Judgment and thought content normal. Cognition and memory are normal.    Results for orders placed or performed in visit on 05/20/16  COMPLETE METABOLIC PANEL WITH GFR  Result Value Ref Range   Sodium 138 135 - 146 mmol/L   Potassium 4.2 3.5 - 5.3 mmol/L   Chloride 105 98 - 110 mmol/L   CO2 26 20 - 31 mmol/L   Glucose, Bld 221 (H) 65 - 99 mg/dL   BUN 15 7 - 25 mg/dL   Creat 0.89 0.70 - 1.25 mg/dL   Total Bilirubin 0.6 0.2 - 1.2 mg/dL   Alkaline Phosphatase 55 40 - 115 U/L   AST 11  10 - 35 U/L   ALT 11 9 - 46 U/L   Total Protein 6.1 6.1 - 8.1 g/dL   Albumin 3.9 3.6 - 5.1 g/dL   Calcium 9.1 8.6 - 10.3 mg/dL   GFR, Est African American >89 >=60 mL/min   GFR, Est Non African American 89 >=60 mL/min  Hemoglobin A1c  Result Value Ref Range   Hgb A1c MFr Bld 9.3 (H) <5.7 %   Mean Plasma Glucose 220 mg/dL   Complete Blood Count (Most recent): Lab Results  Component Value Date   WBC 3.8 (L) 03/24/2015   HGB 13.5 03/24/2015   HCT 40.2 03/24/2015   MCV 86.6 03/24/2015   PLT 223 03/24/2015   Chemistry (most recent): Lab Results  Component Value Date   NA 138 05/20/2016   K 4.2 05/20/2016   CL 105 05/20/2016   CO2 26 05/20/2016   BUN 15 05/20/2016   CREATININE 0.89 05/20/2016   Diabetic Labs (most recent): Lab Results  Component Value Date   HGBA1C 9.3 (H) 05/20/2016   HGBA1C 8.6 11/21/2015   HGBA1C 9.5 (H) 08/16/2015   Lipid profile (most recent): No results found for: TRIG, CHOL       Assessment & Plan:   1. Uncontrolled type 2 diabetes mellitus without complication,  with long-term current use of insulin ([place])  Patient came with inadequate glycemic profile, and his recent A1c rising to 9.3% from 8.6%.  - Patient remains at a high risk for more acute and chronic complications of diabetes which include CAD, CVA, CKD, retinopathy, and neuropathy. These are all discussed in detail with the patient.  - I have re-counseled the patient on diet management and  Weight loss  by adopting a carbohydrate restricted / protein rich  Diet. - Patient is advised to stick to a routine mealtimes to eat 3 meals  a day and avoid unnecessary snacks ( to snack only to correct hypoglycemia).  - Suggestion is made for patient to avoid simple carbohydrates   from their diet including Cakes , Desserts, Ice Cream,  Soda (  diet and regular) , Sweet Tea , Candies,  Chips, Cookies, Artificial Sweeteners,   and "Sugar-free" Products .  This will help patient to have stable blood glucose profile and potentially avoid unintended  Weight gain.  - The patient has been  scheduled with Benjamin Flynn, Benjamin Flynn, Benjamin Flynn for individualized DM education. - I have approached patient with the following individualized plan to manage diabetes and patient agrees.  - Benjamin Flynn remains alarmingly noncompliant and reverses back  to nonadherence once again.  - I advised him to resume monitoring for meals and at bedtime . - He will return in 2 weeks with his meter and logs. -  In the meantime, I  Advised him to continue Humalog 75/25 30 units with breakfast and  30 units with supper for premeal readings of >90.  - he may need basal/bolus insulin based on his readings and his commitments.  - Patient is warned not to take insulin without proper monitoring per orders. -Adjustment parameters are given for hypo and hyperglycemia in writing. -Patient is encouraged to call clinic for blood glucose levels less than 70 or above 300 mg /dl. - he will continue on Metformin 1gm bid, therapeutically suitable for patient. -  He  stopped his Jardiance  on his own for urinary frequency. I attempted to explain to him that urinary frequency is likely due to his hyperglycemia, however he decided to stay off of  Jardiance .  - Patient specific target  for A1c; LDL, HDL, Triglycerides, and  Waist Circumference were discussed in detail.  2) BP/HTN: uncontrolled. Continue current medications including  ARB. 3) HPL- Continue Welchol. 4)  Weight/Diet: Benjamin Flynn consult in progress, exercise, and carbohydrates information provided.  5) Chronic Care/Health Maintenance:  -Patient ARB  and encouraged to continue to follow up with Ophthalmology, Podiatrist at least yearly or according to recommendations, and advised to stay away from smoking. I have recommended yearly flu vaccine and pneumonia vaccination at least every 5 years; moderate intensity exercise for up to 150 minutes weekly; and  sleep for at least 7 hours a day.  I advised patient to maintain close follow up with their PCP for primary care needs.  Patient is asked to bring meter and  blood glucose logs during their next visit.   Follow up plan: Return in about 2 weeks (around 06/24/2016) for follow up with meter and logs- no labs.  Glade Lloyd, MD Phone: [phone]  Fax: [phone]   06/10/2016, 12:03 PM

## 2016-06-24 DIAGNOSIS — E875 Hyperkalemia: Secondary | ICD-10-CM | POA: Diagnosis not present

## 2016-06-24 DIAGNOSIS — E785 Hyperlipidemia, unspecified: Secondary | ICD-10-CM | POA: Diagnosis not present

## 2016-06-24 DIAGNOSIS — N402 Nodular prostate without lower urinary tract symptoms: Secondary | ICD-10-CM | POA: Diagnosis not present

## 2016-06-24 DIAGNOSIS — I10 Essential (primary) hypertension: Secondary | ICD-10-CM | POA: Diagnosis not present

## 2016-06-24 DIAGNOSIS — E1165 Type 2 diabetes mellitus with hyperglycemia: Secondary | ICD-10-CM | POA: Diagnosis not present

## 2016-06-26 ENCOUNTER — Ambulatory Visit: Payer: BLUE CROSS/BLUE SHIELD | Admitting: "Endocrinology

## 2016-07-02 ENCOUNTER — Encounter: Payer: Self-pay | Admitting: "Endocrinology

## 2016-07-02 ENCOUNTER — Ambulatory Visit (INDEPENDENT_AMBULATORY_CARE_PROVIDER_SITE_OTHER): Payer: BLUE CROSS/BLUE SHIELD | Admitting: "Endocrinology

## 2016-07-02 VITALS — BP 140/76 | HR 81 | Ht 71.0 in | Wt 236.0 lb

## 2016-07-02 DIAGNOSIS — Z794 Long term (current) use of insulin: Secondary | ICD-10-CM | POA: Diagnosis not present

## 2016-07-02 DIAGNOSIS — E118 Type 2 diabetes mellitus with unspecified complications: Secondary | ICD-10-CM

## 2016-07-02 DIAGNOSIS — I10 Essential (primary) hypertension: Secondary | ICD-10-CM

## 2016-07-02 DIAGNOSIS — Z9119 Patient's noncompliance with other medical treatment and regimen: Secondary | ICD-10-CM | POA: Diagnosis not present

## 2016-07-02 DIAGNOSIS — E1165 Type 2 diabetes mellitus with hyperglycemia: Secondary | ICD-10-CM

## 2016-07-02 DIAGNOSIS — IMO0002 Reserved for concepts with insufficient information to code with codable children: Secondary | ICD-10-CM

## 2016-07-02 DIAGNOSIS — Z91199 Patient's noncompliance with other medical treatment and regimen due to unspecified reason: Secondary | ICD-10-CM

## 2016-07-02 DIAGNOSIS — J209 Acute bronchitis, unspecified: Secondary | ICD-10-CM | POA: Diagnosis not present

## 2016-07-02 NOTE — Patient Instructions (Signed)
Advice for weight management -For most of us the best way to lose weight is by diet management. Generally speaking, diet management means restricting carbohydrate consumption to minimum possible (and to unprocessed or minimally processed complex starch) and increasing protein intake (animal or plant source), fruits, and vegetables.  -Sticking to a routine mealtime to eat 3 meals a day and avoiding unnecessary snacks is shown to have a big role in weight control.  -It is better to avoid simple carbohydrates including: Cakes, Desserts, Ice Cream, Soda (diet and regular), Sweet Tea, Candies, Chips, Cookies, Artificial Sweeteners, and "Sugar-free" Products.   -Exercise: 30 minutes a day 3-4 days a week, or 150 minutes a week. Combine stretch, strength, and aerobic activities. You may seek evaluation by your heart doctor prior to initiating exercise if you have high risk for heart disease.  -If you are interested, we can schedule a visit with Benjamin Flynn, RDN, CDE for individualized nutrition education.  

## 2016-07-02 NOTE — Progress Notes (Signed)
Subjective:    Patient ID: Benjamin Flynn, male    DOB: 1951/02/24,    Past Medical History:  Diagnosis Date  . Arthritis   . Asthma   . Diabetes ([place])   . GERD (gastroesophageal reflux disease)   . HTN (hypertension)    Past Surgical History:  Procedure Laterality Date  . COLONOSCOPY  2002   Dr. Gala Romney. Normal.  . COLONOSCOPY WITH ESOPHAGOGASTRODUODENOSCOPY (EGD) N/A 02/10/2013   [id number] hiatal hernia. Minimally abnormal-appearing gastric mucosa-status post biopsy. PATH H. PYLORI GASTRITIS:Colonic polyp-TUBULAR ADENOMA   Social History   Social History  . Marital status: Married    Spouse name: N/A  . Number of children: 2  . Years of education: N/A   Occupational History  .  [employer]   Social History Main Topics  . Smoking status: Never Smoker  . Smokeless tobacco: Never Used     Comment: Never smoked  . Alcohol use No     Comment: former user  . Drug use: No  . Sexual activity: Yes    Birth control/ protection: None   Other Topics Concern  . None   Social History Narrative  . None   Outpatient Encounter Prescriptions as of 07/02/2016  Medication Sig  . amLODipine (NORVASC) 5 MG tablet Take 5 mg by mouth daily.  Marland Kitchen CARAFATE 1 GM/10ML suspension TAKE  10 ML BY MOUTH 4 TIMES DAILY AS NEEDED  . esomeprazole (NEXIUM) 40 MG capsule Take 1 capsule (40 mg total) by mouth every morning.  Marland Kitchen glucose blood (ONE TOUCH ULTRA TEST) test strip Use as instructed  To test glucose 2 times a day.  Marland Kitchen HUMALOG MIX 75/25 KWIKPEN (75-25) 100 UNIT/ML Kwikpen INJECT 30 UNITS SUBCUTANEOUSLY TWICE DAILY.  Marland Kitchen LITETOUCH PEN NEEDLES 31G X 8 MM MISC USE TO INJECT INSULIN TWICE DAILY  . losartan (COZAAR) 25 MG tablet Take 25 mg by mouth daily. Patient unsure of dose.  . metFORMIN (GLUCOPHAGE) 1000 MG tablet Take 1 tablet (1,000 mg total) by mouth 2 (two) times daily with a meal.  . NOVOFINE 30G X 8 MM MISC USE TWICE DAILY  . RABEprazole (ACIPHEX) 20 MG tablet Take 1 tablet (20  mg total) by mouth daily. 30 minutes before breakfast.  . tadalafil (CIALIS) 5 MG tablet Take 5 mg by mouth daily as needed for erectile dysfunction.   No facility-administered encounter medications on file as of 07/02/2016.    ALLERGIES: No Known Allergies  Diabetes  He presents for his follow-up diabetic visit. He has type 2 diabetes mellitus. His disease course has been improving. There are no hypoglycemic associated symptoms. Pertinent negatives for hypoglycemia include no confusion, headaches, pallor or seizures. Associated symptoms include polydipsia and polyuria. Pertinent negatives for diabetes include no chest pain, no fatigue, no polyphagia and no weakness. There are no hypoglycemic complications. Symptoms are improving. There are no diabetic complications. Risk factors for coronary artery disease include dyslipidemia, diabetes mellitus, hypertension, male sex, obesity and sedentary lifestyle. He is compliant with treatment most of the time (He did not comply with insulin instructions. He did not monitor enough). His weight is stable. He rarely participates in exercise. Home blood sugar record trend: He did not bring the meter nor logs to review today. His overall blood glucose range is 140-180 mg/dl. An ACE inhibitor/angiotensin II receptor blocker is being taken.  Hypertension  This is a chronic problem. The current episode started more than 1 year ago. Pertinent negatives include no chest pain, headaches,  neck pain, palpitations or shortness of breath. Past treatments include alpha 1 blockers.  Hyperlipidemia  This is a chronic problem. The current episode started more than 1 year ago. Pertinent negatives include no chest pain, myalgias or shortness of breath.     Review of Systems  Constitutional: Negative for fatigue and unexpected weight change.  HENT: Negative for dental problem, mouth sores and trouble swallowing.   Eyes: Negative for visual disturbance.  Respiratory: Negative  for cough, choking, chest tightness, shortness of breath and wheezing.   Cardiovascular: Negative for chest pain, palpitations and leg swelling.  Gastrointestinal: Negative for abdominal distention, abdominal pain, constipation, diarrhea, nausea and vomiting.  Endocrine: Positive for polydipsia and polyuria. Negative for polyphagia.  Genitourinary: Negative for dysuria, flank pain, hematuria and urgency.  Musculoskeletal: Negative for back pain, gait problem, myalgias and neck pain.  Skin: Negative for pallor, rash and wound.  Neurological: Negative for seizures, syncope, weakness, numbness and headaches.  Psychiatric/Behavioral: Negative.  Negative for confusion and dysphoric mood.    Objective:    BP 140/76   Pulse 81   Ht 5\' 11"  (1.803 m)   Wt 236 lb (107 kg)   BMI 32.92 kg/m   Wt Readings from Last 3 Encounters:  07/02/16 236 lb (107 kg)  06/10/16 237 lb (107.5 kg)  06/03/16 238 lb (108 kg)    Physical Exam  Constitutional: He is oriented to person, place, and time. He appears well-developed and well-nourished. He is cooperative. No distress.  HENT:  Head: Normocephalic and atraumatic.  Eyes: EOM are normal.  Neck: Normal range of motion. Neck supple. No tracheal deviation present. No thyromegaly present.  Cardiovascular: Normal rate, S1 normal, S2 normal and normal heart sounds.  Exam reveals no gallop.   No murmur heard. Pulses:      Dorsalis pedis pulses are 1+ on the right side, and 1+ on the left side.       Posterior tibial pulses are 1+ on the right side, and 1+ on the left side.  Pulmonary/Chest: Breath sounds normal. No respiratory distress. He has no wheezes.  Abdominal: Soft. Bowel sounds are normal. He exhibits no distension. There is no tenderness. There is no guarding and no CVA tenderness.  Musculoskeletal: He exhibits no edema.       Right shoulder: He exhibits no swelling and no deformity.  Neurological: He is alert and oriented to person, place, and time.  He has normal strength and normal reflexes. No cranial nerve deficit or sensory deficit. Gait normal.  Skin: Skin is warm and dry. No rash noted. No cyanosis. Nails show no clubbing.  Psychiatric: He has a normal mood and affect. His speech is normal and behavior is normal. Judgment and thought content normal. Cognition and memory are normal.    Results for orders placed or performed in visit on 05/20/16  COMPLETE METABOLIC PANEL WITH GFR  Result Value Ref Range   Sodium 138 135 - 146 mmol/L   Potassium 4.2 3.5 - 5.3 mmol/L   Chloride 105 98 - 110 mmol/L   CO2 26 20 - 31 mmol/L   Glucose, Bld 221 (H) 65 - 99 mg/dL   BUN 15 7 - 25 mg/dL   Creat 0.89 0.70 - 1.25 mg/dL   Total Bilirubin 0.6 0.2 - 1.2 mg/dL   Alkaline Phosphatase 55 40 - 115 U/L   AST 11 10 - 35 U/L   ALT 11 9 - 46 U/L   Total Protein 6.1 6.1 - 8.1 g/dL  Albumin 3.9 3.6 - 5.1 g/dL   Calcium 9.1 8.6 - 10.3 mg/dL   GFR, Est African American >89 >=60 mL/min   GFR, Est Non African American 89 >=60 mL/min  Hemoglobin A1c  Result Value Ref Range   Hgb A1c MFr Bld 9.3 (H) <5.7 %   Mean Plasma Glucose 220 mg/dL   Complete Blood Count (Most recent): Lab Results  Component Value Date   WBC 3.8 (L) 03/24/2015   HGB 13.5 03/24/2015   HCT 40.2 03/24/2015   MCV 86.6 03/24/2015   PLT 223 03/24/2015   Chemistry (most recent): Lab Results  Component Value Date   NA 138 05/20/2016   K 4.2 05/20/2016   CL 105 05/20/2016   CO2 26 05/20/2016   BUN 15 05/20/2016   CREATININE 0.89 05/20/2016   Diabetic Labs (most recent): Lab Results  Component Value Date   HGBA1C 9.3 (H) 05/20/2016   HGBA1C 8.6 11/21/2015   HGBA1C 9.5 (H) 08/16/2015    Assessment & Plan:   1. Uncontrolled type 2 diabetes mellitus without complication, with long-term current use of insulin ([place])  Patient came with inadequate glycemic profile, and his recent A1c rising to 9.3% from 8.6%. - He came with inadequate monitoring however overall improving  average blood glucose to 183 over the last 14 days.  - Patient remains at a high risk for more acute and chronic complications of diabetes which include CAD, CVA, CKD, retinopathy, and neuropathy. These are all discussed in detail with the patient.  - I have re-counseled the patient on diet management and  Weight loss  by adopting a carbohydrate restricted / protein rich  Diet. - Patient is advised to stick to a routine mealtimes to eat 3 meals  a day and avoid unnecessary snacks ( to snack only to correct hypoglycemia).  - Suggestion is made for patient to avoid simple carbohydrates   from their diet including Cakes , Desserts, Ice Cream,  Soda (  diet and regular) , Sweet Tea , Candies,  Chips, Cookies, Artificial Sweeteners,   and "Sugar-free" Products .  This will help patient to have stable blood glucose profile and potentially avoid unintended  Weight gain.  - The patient has been  scheduled with Jearld Fenton, RDN, CDE for individualized DM education. - I have approached patient with the following individualized plan to manage diabetes and patient agrees.  - Mr. Tisdell  Has been armingly noncompliant and easily  reverses back  to non-adherence.  - I advised him to continue monitoring of blood glucose at least twice a day before breakfast and before supper, as well as at any other time as needed. - I dvised him to continue Humalog 75/25 30 units with breakfast and  30 units with supper for premeal readings of >90.  - he may need basal/bolus insulin based on his readings and his commitments.  - Patient is warned not to take insulin without proper monitoring per orders. -Adjustment parameters are given for hypo and hyperglycemia in writing. -Patient is encouraged to call clinic for blood glucose levels less than 70 or above 300 mg /dl. - he will continue on Metformin 1gm bid, therapeutically suitable for patient. -  He stopped his Jardiance  on his own for urinary frequency. I attempted to  explain to him that urinary frequency is likely due to his hyperglycemia, however he decided to stay off of Jardiance .  - Patient specific target  for A1c; LDL, HDL, Triglycerides, and  Waist Circumference were  discussed in detail.  2) BP/HTN: uncontrolled. Continue current medications including  ARB. 3) HPL- Continue Welchol. 4)  Weight/Diet: CDE consult in progress, exercise, and carbohydrates information provided.  5) Chronic Care/Health Maintenance:  -Patient ARB  and encouraged to continue to follow up with Ophthalmology, Podiatrist at least yearly or according to recommendations, and advised to stay away from smoking. I have recommended yearly flu vaccine and pneumonia vaccination at least every 5 years; moderate intensity exercise for up to 150 minutes weekly; and  sleep for at least 7 hours a day.  I advised patient to maintain close follow up with their PCP for primary care needs.  Patient is asked to bring meter and  blood glucose logs during their next visit.   Follow up plan: Return in about 9 weeks (around 09/03/2016) for follow up with pre-visit labs, meter, and logs.  Glade Lloyd, MD Phone: [phone]  Fax: [phone]   07/02/2016, 11:29 AM

## 2016-07-12 ENCOUNTER — Other Ambulatory Visit: Payer: Self-pay | Admitting: "Endocrinology

## 2016-09-02 DIAGNOSIS — M545 Low back pain: Secondary | ICD-10-CM | POA: Diagnosis not present

## 2016-09-05 ENCOUNTER — Ambulatory Visit: Payer: BLUE CROSS/BLUE SHIELD | Admitting: "Endocrinology

## 2016-09-09 ENCOUNTER — Other Ambulatory Visit: Payer: Self-pay | Admitting: "Endocrinology

## 2016-09-26 ENCOUNTER — Ambulatory Visit: Payer: BLUE CROSS/BLUE SHIELD | Admitting: "Endocrinology

## 2016-09-29 IMAGING — CT CT CTA ABD/PEL W/CM AND/OR W/O CM
2 of 9 series · 10 of 46 positions shown, 17 images · IV contrast (Omnipaque 300)
Comparison: None.

CLINICAL DATA: Mid and lower abdominal pain for 2 weeks. Evaluate
for mesenteric ischemia.

EXAM:
CT ANGIOGRAPHY ABDOMEN AND PELVIS WITH CONTRAST
TECHNIQUE: Multidetector CT imaging of the abdomen and pelvis was performed
using the standard protocol during bolus administration of
intravenous contrast. Multiplanar reconstructed images including
MIPs were obtained and reviewed to evaluate the vascular anatomy.
CONTRAST:  120mL OMNIPAQUE IOHEXOL 300 MG/ML  SOLN

[Series 5: mpr cor post contrast cor · coronal · 0.91mm/px · 1 of 111 slices shown, 2 images]
[im 56/111  soft-tissue]
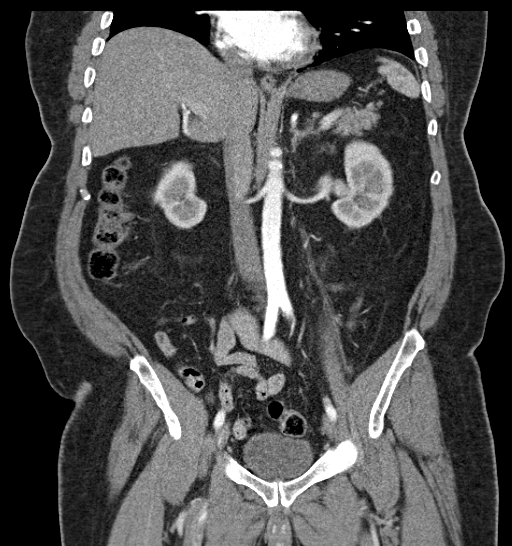
[im 56/111  bone]
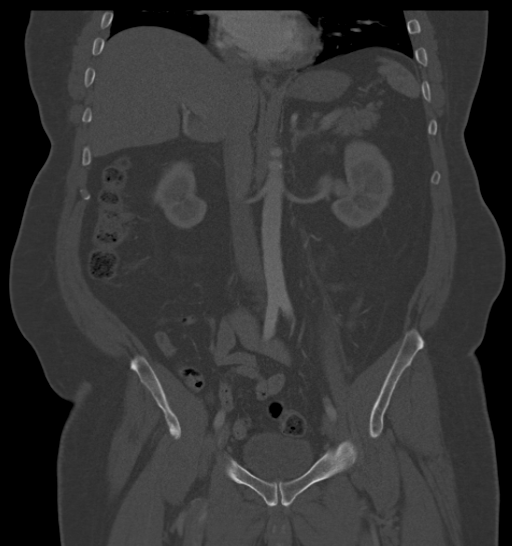

[Series 8: venous 5.0 b30f · axial · portal-venous · 0.94mm/px · z∈[+784,+1174]mm · 9 of 98 slices shown, 15 images]
[im 10/98  soft-tissue]
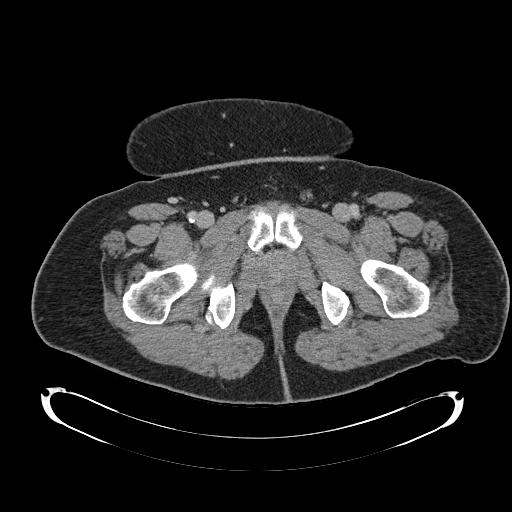
[im 10/98  bone]
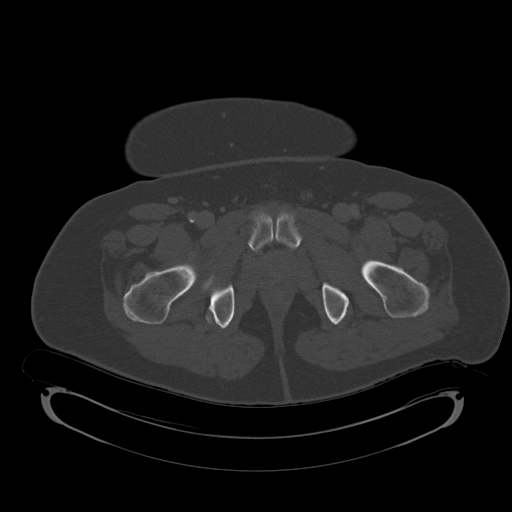
[im 20/98  soft-tissue]
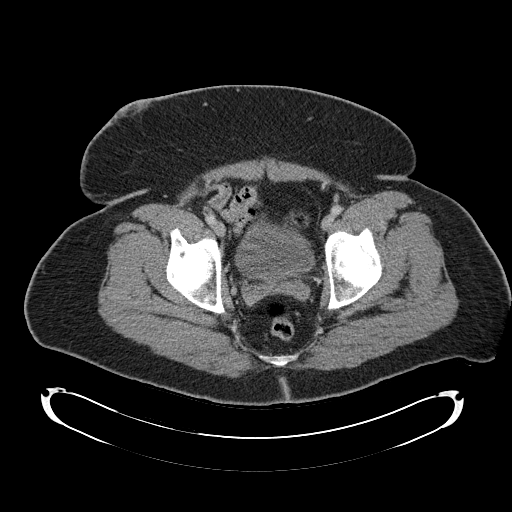
[im 30/98  soft-tissue]
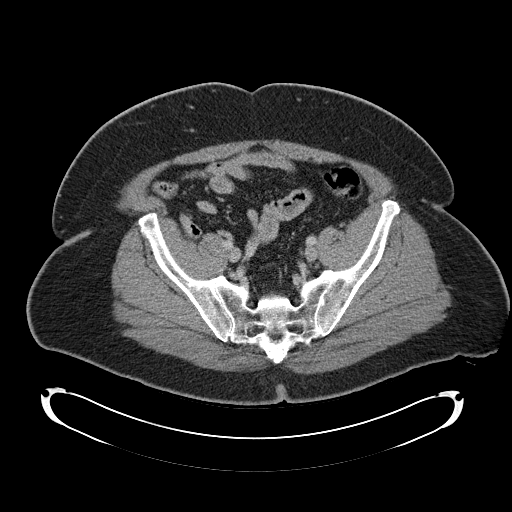
[im 39/98  soft-tissue]
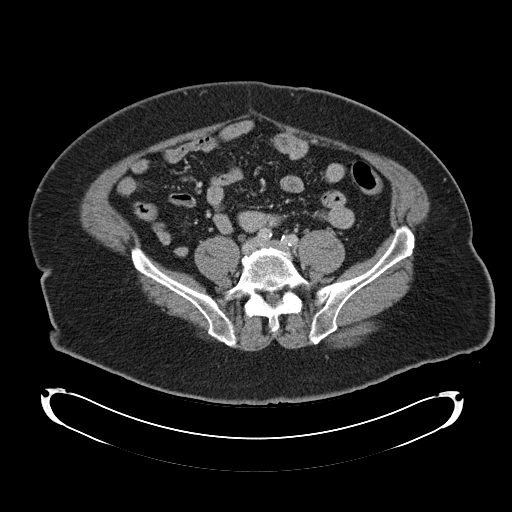
[im 49/98  soft-tissue]
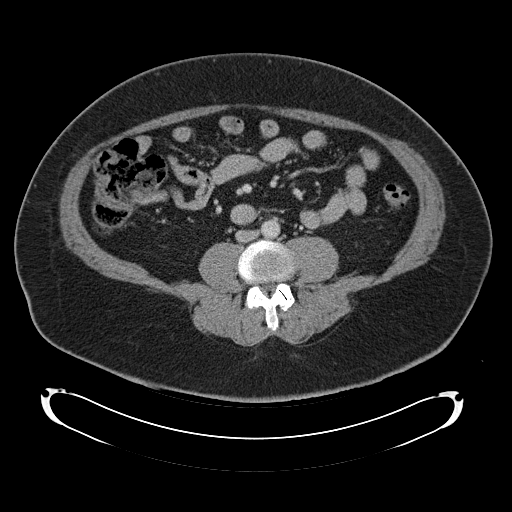
[im 59/98  soft-tissue]
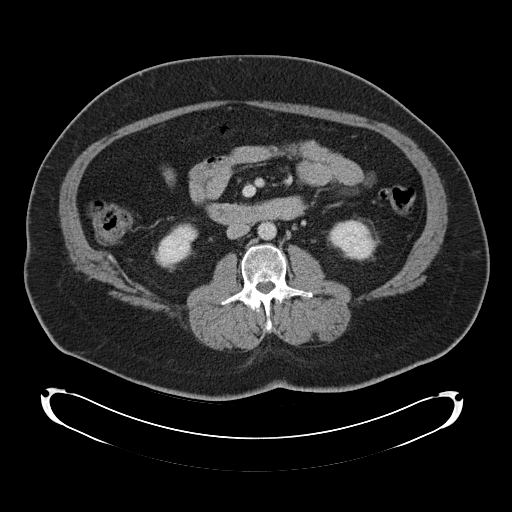
[im 59/98  lung]
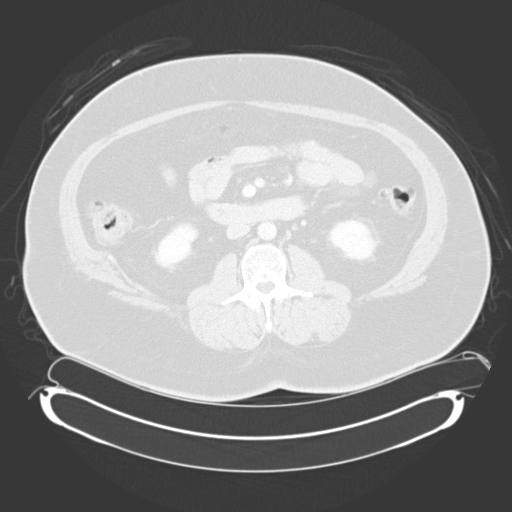
[im 68/98  soft-tissue]
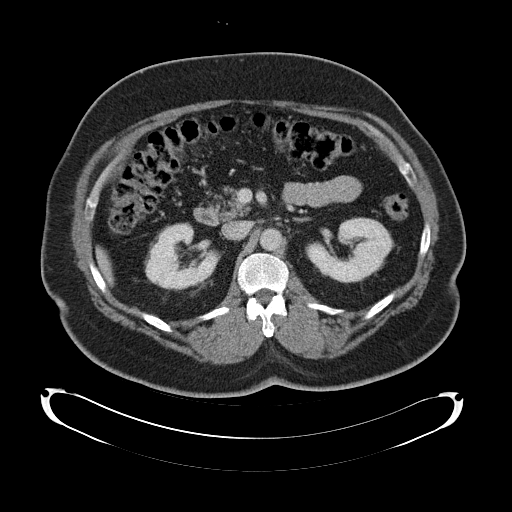
[im 68/98  lung]
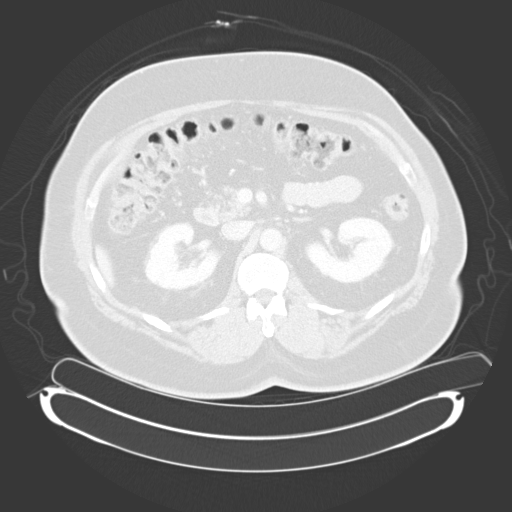
[im 78/98  soft-tissue]
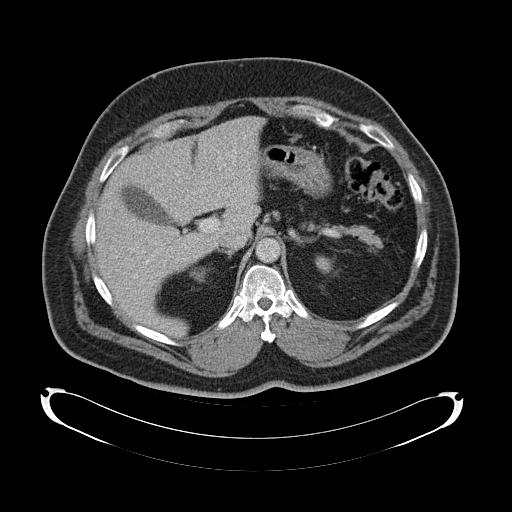
[im 78/98  lung]
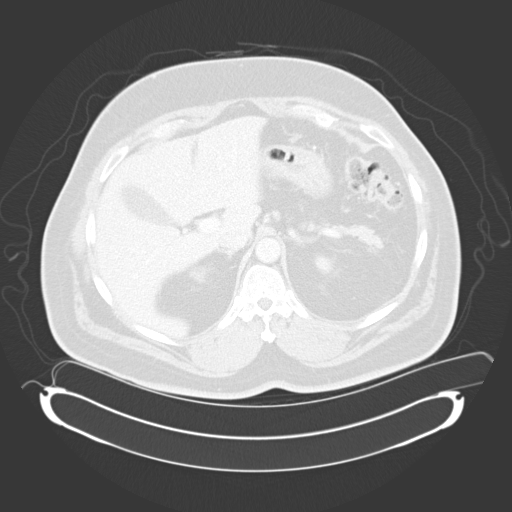
[im 88/98  soft-tissue]
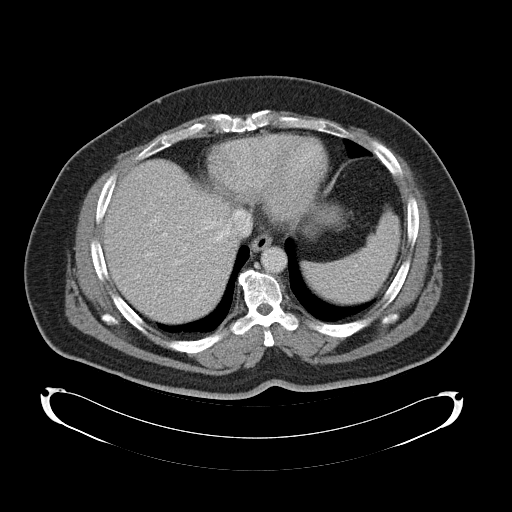
[im 88/98  lung]
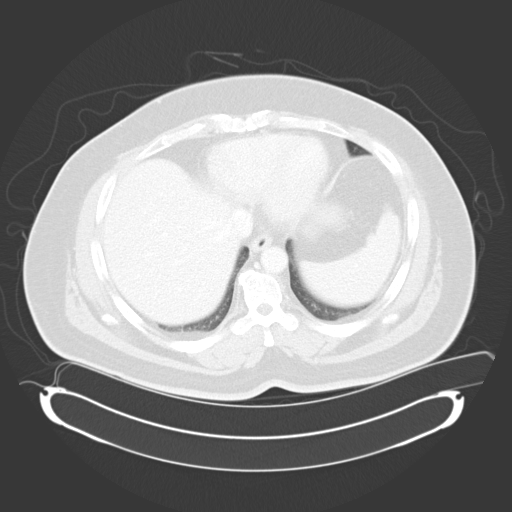
[im 88/98  bone]
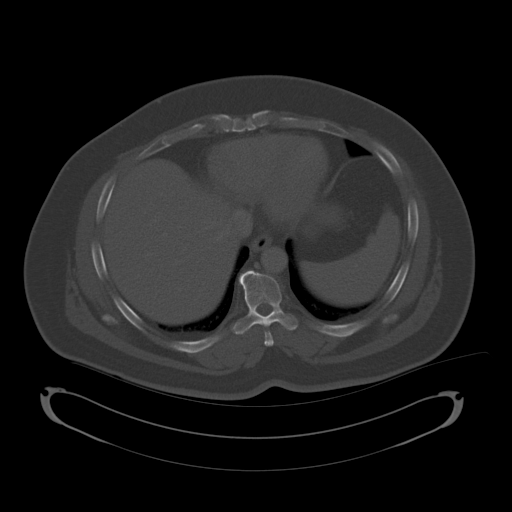

[10 of 46 positions shown; findings below may reference images not displayed]

FINDINGS: Vascular Findings:

Abdominal aorta: There is a very minimal amount of mixed calcified
and noncalcified atherosclerotic plaque with a normal caliber
abdominal aorta, not resulting in hemodynamically significant
stenosis. No abdominal aortic dissection or periaortic stranding.

Celiac artery: Widely patent without hemodynamically significant
narrowing. Incidental note is made of a replaced left hepatic artery
which arise from the left gastric artery.

SMA: There is a minimal amount of eccentric mixed calcified and
noncalcified atherosclerotic plaque involving the left caudal aspect
of the origin the SMA, not resulting in a hemodynamically
significant stenosis. Conventional branching pattern. The distal
tributaries of the SMA are widely patent without discrete
intraluminal filling defect.

Right Renal artery: Solitary; widely patent without hemodynamically
significant narrowing. No vessel irregularity to suggest FMD.

Left Renal artery: Solitary; widely patent without hemodynamically
significant narrowing. No vessel irregularity to suggest FMD.

IMA: Widely patent throughout its imaged course.

Pelvic vasculature: There is a minimal amount of eccentric mixed
calcified and noncalcified atherosclerotic plaque within the
bilateral common and internal iliac arteries, not resulting in
hemodynamically significant stenosis. The bilateral common, internal
and external iliac arteries are of normal caliber and widely patent
without hemodynamically significant stenosis.

Review of the MIP images confirms the above findings.

--------------------------------------------------------------------------------

Nonvascular Findings:

Normal hepatic contour. No discrete hepatic lesions. Normal
appearance of the gallbladder given degree distention. No radiopaque
gallstones. No intra extrahepatic biliary duct dilatation. No
ascites.

There is symmetric enhancement of the bilateral kidneys. No definite
renal stones this postcontrast examination. No discrete renal
lesions. There is a minimal amount of symmetric likely age and body
habitus related bilateral perinephric stranding. Urinary
obstruction. Normal appearance of the bilateral adrenal glands,
pancreas and spleen.

Moderate colonic stool burden without evidence of enteric
obstruction. Normal appearance of the terminal ileum and retrocecal
appendix. No pneumoperitoneum, pneumatosis or portal venous gas.

No bulky retroperitoneal, mesenteric, pelvic or inguinal
lymphadenopathy.

The prostate is borderline enlarged with mass effect on the
undersurface of the urinary bladder. Normal appearance of the
urinary bladder given degree distention. No free fluid in the pelvic
cul-de-sac.

Limited visualization of the lower thorax demonstrates minimal
dependent subpleural ground-glass atelectasis within the right lower
lobe. No focal airspace opacities. No pleural effusion.

Normal heart size.  No pericardial effusion.

No acute or aggressive osseous abnormalities. Stigmata of DISH
within the caudal aspect of the thoracic spine. Mild degenerative
change of the bilateral hips.

There is a minimal amount of skin thickening about the right
inferior lateral aspect of the abdominal pannus (image 133, series
4), without associated stranding. Tiny mesenteric fat containing
periumbilical hernia.
IMPRESSION: Vascular Impression:

1. Scattered minimal amount of mixed calcified and noncalcified
atherosclerotic plaque within a normal caliber abdominal aorta. No
evidence of mesenteric ischemia.
Nonvascular Impression:

1. No explanation for patient's upper and mid abdominal pain.
Specifically, no evidence of enteric or urinary obstruction.
2. Moderate to large colonic stool burden.

## 2016-10-04 ENCOUNTER — Ambulatory Visit: Payer: BLUE CROSS/BLUE SHIELD | Admitting: Urology

## 2016-10-05 DIAGNOSIS — R972 Elevated prostate specific antigen [PSA]: Secondary | ICD-10-CM | POA: Diagnosis not present

## 2016-10-10 ENCOUNTER — Ambulatory Visit: Payer: BLUE CROSS/BLUE SHIELD | Admitting: "Endocrinology

## 2016-10-28 ENCOUNTER — Other Ambulatory Visit: Payer: Self-pay | Admitting: "Endocrinology

## 2016-11-01 ENCOUNTER — Ambulatory Visit (INDEPENDENT_AMBULATORY_CARE_PROVIDER_SITE_OTHER): Payer: BLUE CROSS/BLUE SHIELD | Admitting: Urology

## 2016-11-01 DIAGNOSIS — M549 Dorsalgia, unspecified: Secondary | ICD-10-CM

## 2016-11-01 DIAGNOSIS — R972 Elevated prostate specific antigen [PSA]: Secondary | ICD-10-CM

## 2016-11-07 DIAGNOSIS — J309 Allergic rhinitis, unspecified: Secondary | ICD-10-CM | POA: Diagnosis not present

## 2016-11-07 DIAGNOSIS — E1165 Type 2 diabetes mellitus with hyperglycemia: Secondary | ICD-10-CM | POA: Diagnosis not present

## 2016-11-07 DIAGNOSIS — I10 Essential (primary) hypertension: Secondary | ICD-10-CM | POA: Diagnosis not present

## 2016-11-29 ENCOUNTER — Other Ambulatory Visit: Payer: Self-pay | Admitting: "Endocrinology

## 2016-12-09 ENCOUNTER — Other Ambulatory Visit: Payer: Self-pay | Admitting: "Endocrinology

## 2016-12-13 ENCOUNTER — Other Ambulatory Visit: Payer: Self-pay | Admitting: "Endocrinology

## 2017-01-27 ENCOUNTER — Other Ambulatory Visit: Payer: Self-pay | Admitting: "Endocrinology

## 2017-01-27 DIAGNOSIS — E1165 Type 2 diabetes mellitus with hyperglycemia: Secondary | ICD-10-CM

## 2017-01-27 DIAGNOSIS — E118 Type 2 diabetes mellitus with unspecified complications: Principal | ICD-10-CM

## 2017-01-27 DIAGNOSIS — E039 Hypothyroidism, unspecified: Secondary | ICD-10-CM | POA: Diagnosis not present

## 2017-01-27 DIAGNOSIS — Z794 Long term (current) use of insulin: Secondary | ICD-10-CM | POA: Diagnosis not present

## 2017-01-27 DIAGNOSIS — E785 Hyperlipidemia, unspecified: Secondary | ICD-10-CM

## 2017-01-27 LAB — COMPREHENSIVE METABOLIC PANEL
ALBUMIN: 4.2 g/dL (ref 3.6–5.1)
ALK PHOS: 64 U/L (ref 40–115)
ALT: 10 U/L (ref 9–46)
AST: 10 U/L (ref 10–35)
BUN: 18 mg/dL (ref 7–25)
CALCIUM: 9.2 mg/dL (ref 8.6–10.3)
CO2: 21 mmol/L (ref 20–31)
Chloride: 104 mmol/L (ref 98–110)
Creat: 0.88 mg/dL (ref 0.70–1.25)
GLUCOSE: 192 mg/dL — AB (ref 65–99)
POTASSIUM: 3.9 mmol/L (ref 3.5–5.3)
Sodium: 140 mmol/L (ref 135–146)
TOTAL PROTEIN: 6.5 g/dL (ref 6.1–8.1)
Total Bilirubin: 0.4 mg/dL (ref 0.2–1.2)

## 2017-01-27 LAB — T4, FREE: Free T4: 1.2 ng/dL (ref 0.8–1.8)

## 2017-01-27 LAB — TSH: TSH: 2.44 mIU/L (ref 0.40–4.50)

## 2017-01-27 LAB — LIPID PANEL
CHOL/HDL RATIO: 3.8 ratio (ref <5.0)
CHOLESTEROL: 177 mg/dL (ref <200)
HDL: 46 mg/dL (ref >40)
LDL Cholesterol: 120 mg/dL — ABNORMAL HIGH (ref <100)
TRIGLYCERIDES: 54 mg/dL (ref <150)
VLDL: 11 mg/dL (ref <30)

## 2017-01-28 LAB — HEMOGLOBIN A1C
Hgb A1c MFr Bld: 10 % — ABNORMAL HIGH (ref <5.7)
Mean Plasma Glucose: 240 mg/dL

## 2017-01-28 LAB — MICROALBUMIN / CREATININE URINE RATIO
Creatinine, Urine: 188 mg/dL (ref 20–370)
MICROALB UR: 1.1 mg/dL
Microalb Creat Ratio: 6 mcg/mg creat (ref <30)

## 2017-02-04 ENCOUNTER — Ambulatory Visit (INDEPENDENT_AMBULATORY_CARE_PROVIDER_SITE_OTHER): Payer: BLUE CROSS/BLUE SHIELD | Admitting: "Endocrinology

## 2017-02-04 ENCOUNTER — Encounter: Payer: Self-pay | Admitting: "Endocrinology

## 2017-02-04 VITALS — BP 157/81 | HR 78 | Ht 71.0 in | Wt 234.0 lb

## 2017-02-04 DIAGNOSIS — I10 Essential (primary) hypertension: Secondary | ICD-10-CM

## 2017-02-04 DIAGNOSIS — Z794 Long term (current) use of insulin: Secondary | ICD-10-CM

## 2017-02-04 DIAGNOSIS — E1165 Type 2 diabetes mellitus with hyperglycemia: Secondary | ICD-10-CM

## 2017-02-04 DIAGNOSIS — Z6832 Body mass index (BMI) 32.0-32.9, adult: Secondary | ICD-10-CM | POA: Diagnosis not present

## 2017-02-04 DIAGNOSIS — E118 Type 2 diabetes mellitus with unspecified complications: Secondary | ICD-10-CM | POA: Diagnosis not present

## 2017-02-04 DIAGNOSIS — E6609 Other obesity due to excess calories: Secondary | ICD-10-CM | POA: Diagnosis not present

## 2017-02-04 DIAGNOSIS — IMO0002 Reserved for concepts with insufficient information to code with codable children: Secondary | ICD-10-CM

## 2017-02-04 DIAGNOSIS — Z91199 Patient's noncompliance with other medical treatment and regimen due to unspecified reason: Secondary | ICD-10-CM

## 2017-02-04 DIAGNOSIS — Z9119 Patient's noncompliance with other medical treatment and regimen: Secondary | ICD-10-CM | POA: Diagnosis not present

## 2017-02-04 DIAGNOSIS — E66811 Obesity, class 1: Secondary | ICD-10-CM

## 2017-02-04 MED ORDER — INSULIN LISPRO PROT & LISPRO (75-25 MIX) 100 UNIT/ML KWIKPEN: PEN_INJECTOR | SUBCUTANEOUS | 0 refills | Status: DC

## 2017-02-04 MED ORDER — METFORMIN HCL 1000 MG PO TABS: 1000.0000 mg | ORAL_TABLET | Freq: Two times a day (BID) | ORAL | 0 refills | Status: DC

## 2017-02-04 NOTE — Progress Notes (Signed)
Subjective:    Patient ID: Benjamin Flynn, male    DOB: 24-Jun-1951,    Past Medical History:  Diagnosis Date  . Arthritis   . Asthma   . Diabetes ([place])   . GERD (gastroesophageal reflux disease)   . HTN (hypertension)    Past Surgical History:  Procedure Laterality Date  . COLONOSCOPY  2002   Dr. Gala Romney. Normal.  . COLONOSCOPY WITH ESOPHAGOGASTRODUODENOSCOPY (EGD) N/A 02/10/2013   [id number] hiatal hernia. Minimally abnormal-appearing gastric mucosa-status post biopsy. PATH H. PYLORI GASTRITIS:Colonic polyp-TUBULAR ADENOMA   Social History   Social History  . Marital status: Married    Spouse name: N/A  . Number of children: 2  . Years of education: N/A   Occupational History  .  [employer]   Social History Main Topics  . Smoking status: Never Smoker  . Smokeless tobacco: Never Used     Comment: Never smoked  . Alcohol use No     Comment: former user  . Drug use: No  . Sexual activity: Yes    Birth control/ protection: None   Other Topics Concern  . None   Social History Narrative  . None   Outpatient Encounter Prescriptions as of 02/04/2017  Medication Sig  . amLODipine (NORVASC) 5 MG tablet Take 5 mg by mouth daily.  Marland Kitchen CARAFATE 1 GM/10ML suspension TAKE  10 ML BY MOUTH 4 TIMES DAILY AS NEEDED  . esomeprazole (NEXIUM) 40 MG capsule Take 1 capsule (40 mg total) by mouth every morning.  . Insulin Lispro Prot & Lispro (HUMALOG MIX 75/25 KWIKPEN) (75-25) 100 UNIT/ML Kwikpen INJECT 30 UNITS SUBCUTANEOUSLY TWICE DAILY WITH BREAKFAST AND SUPPER.  Marland Kitchen losartan (COZAAR) 25 MG tablet Take 25 mg by mouth daily. Patient unsure of dose.  . metFORMIN (GLUCOPHAGE) 1000 MG tablet Take 1 tablet (1,000 mg total) by mouth 2 (two) times daily with a meal.  . NOVOFINE 30G X 8 MM MISC USE TWICE DAILY  . ONE TOUCH ULTRA TEST test strip USE TO CHECK BLOOD SUGAR TWICE DAILY.  . RABEprazole (ACIPHEX) 20 MG tablet Take 1 tablet (20 mg total) by mouth daily. 30 minutes before  breakfast.  . tadalafil (CIALIS) 5 MG tablet Take 5 mg by mouth daily as needed for erectile dysfunction.  Marland Kitchen ULTICARE SHORT PEN NEEDLES 31G X 8 MM MISC USE TO INJECT INSULIN TWICE DAILY  . [DISCONTINUED] HUMALOG MIX 75/25 KWIKPEN (75-25) 100 UNIT/ML Kwikpen INJECT 30 UNITS SUBCUTANEOUSLY TWICE DAILY. (Patient not taking: Reported on 02/04/2017)  . [DISCONTINUED] metFORMIN (GLUCOPHAGE) 1000 MG tablet TAKE ONE TABLET BY MOUTH TWICE DAILY WITH A MEAL (Patient not taking: Reported on 02/04/2017)   No facility-administered encounter medications on file as of 02/04/2017.    ALLERGIES: No Known Allergies  Diabetes  He presents for his follow-up diabetic visit. He has type 2 diabetes mellitus. His disease course has been worsening. There are no hypoglycemic associated symptoms. Pertinent negatives for hypoglycemia include no confusion, headaches, pallor or seizures. Associated symptoms include polydipsia and polyuria. Pertinent negatives for diabetes include no chest pain, no fatigue, no polyphagia and no weakness. There are no hypoglycemic complications. Symptoms are worsening. There are no diabetic complications. Risk factors for coronary artery disease include dyslipidemia, diabetes mellitus, hypertension, male sex, obesity and sedentary lifestyle. He is compliant with treatment none of the time (He did not comply with insulin instructions. He did not monitor enough). His weight is stable. He rarely participates in exercise. Blood glucose monitoring compliance is poor.  His overall blood glucose range is >200 mg/dl. An ACE inhibitor/angiotensin II receptor blocker is being taken.  Hypertension  This is a chronic problem. The current episode started more than 1 year ago. Pertinent negatives include no chest pain, headaches, neck pain, palpitations or shortness of breath. Past treatments include alpha 1 blockers.  Hyperlipidemia  This is a chronic problem. The current episode started more than 1 year ago.  Pertinent negatives include no chest pain, myalgias or shortness of breath.     Review of Systems  Constitutional: Negative for fatigue and unexpected weight change.  HENT: Negative for dental problem, mouth sores and trouble swallowing.   Eyes: Negative for visual disturbance.  Respiratory: Negative for cough, choking, chest tightness, shortness of breath and wheezing.   Cardiovascular: Negative for chest pain, palpitations and leg swelling.  Gastrointestinal: Negative for abdominal distention, abdominal pain, constipation, diarrhea, nausea and vomiting.  Endocrine: Positive for polydipsia and polyuria. Negative for polyphagia.  Genitourinary: Negative for dysuria, flank pain, hematuria and urgency.  Musculoskeletal: Negative for back pain, gait problem, myalgias and neck pain.  Skin: Negative for pallor, rash and wound.  Neurological: Negative for seizures, syncope, weakness, numbness and headaches.  Psychiatric/Behavioral: Negative.  Negative for confusion and dysphoric mood.    Objective:    BP (!) 157/81   Pulse 78   Ht 5\' 11"  (1.803 m)   Wt 234 lb (106.1 kg)   BMI 32.64 kg/m   Wt Readings from Last 3 Encounters:  02/04/17 234 lb (106.1 kg)  07/02/16 236 lb (107 kg)  06/10/16 237 lb (107.5 kg)    Physical Exam  Constitutional: He is oriented to person, place, and time. He appears well-developed and well-nourished. He is cooperative. No distress.  HENT:  Head: Normocephalic and atraumatic.  Eyes: EOM are normal.  Neck: Normal range of motion. Neck supple. No tracheal deviation present. No thyromegaly present.  Cardiovascular: Normal rate, S1 normal, S2 normal and normal heart sounds.  Exam reveals no gallop.   No murmur heard. Pulses:      Dorsalis pedis pulses are 1+ on the right side, and 1+ on the left side.       Posterior tibial pulses are 1+ on the right side, and 1+ on the left side.  Pulmonary/Chest: Breath sounds normal. No respiratory distress. He has no  wheezes.  Abdominal: Soft. Bowel sounds are normal. He exhibits no distension. There is no tenderness. There is no guarding and no CVA tenderness.  Musculoskeletal: He exhibits no edema.       Right shoulder: He exhibits no swelling and no deformity.  Neurological: He is alert and oriented to person, place, and time. He has normal strength and normal reflexes. No cranial nerve deficit or sensory deficit. Gait normal.  Skin: Skin is warm and dry. No rash noted. No cyanosis. Nails show no clubbing.  Psychiatric: He has a normal mood and affect. His speech is normal and behavior is normal. Judgment and thought content normal. Cognition and memory are normal.    Results for orders placed or performed in visit on 01/27/17  T4, Free  Result Value Ref Range   Free T4 1.2 0.8 - 1.8 ng/dL  Microalbumin / creatinine urine ratio  Result Value Ref Range   Creatinine, Urine 188 20 - 370 mg/dL   Microalb, Ur 1.1 Not estab mg/dL   Microalb Creat Ratio 6 <30 mcg/mg creat  Comprehensive metabolic panel  Result Value Ref Range   Sodium 140 135 - 146  mmol/L   Potassium 3.9 3.5 - 5.3 mmol/L   Chloride 104 98 - 110 mmol/L   CO2 21 20 - 31 mmol/L   Glucose, Bld 192 (H) 65 - 99 mg/dL   BUN 18 7 - 25 mg/dL   Creat 0.88 0.70 - 1.25 mg/dL   Total Bilirubin 0.4 0.2 - 1.2 mg/dL   Alkaline Phosphatase 64 40 - 115 U/L   AST 10 10 - 35 U/L   ALT 10 9 - 46 U/L   Total Protein 6.5 6.1 - 8.1 g/dL   Albumin 4.2 3.6 - 5.1 g/dL   Calcium 9.2 8.6 - 10.3 mg/dL  Lipid panel  Result Value Ref Range   Cholesterol 177 <200 mg/dL   Triglycerides 54 <150 mg/dL   HDL 46 >40 mg/dL   Total CHOL/HDL Ratio 3.8 <5.0 Ratio   VLDL 11 <30 mg/dL   LDL Cholesterol 120 (H) <100 mg/dL  TSH  Result Value Ref Range   TSH 2.44 0.40 - 4.50 mIU/L  Hemoglobin A1c  Result Value Ref Range   Hgb A1c MFr Bld 10.0 (H) <5.7 %   Mean Plasma Glucose 240 mg/dL   Complete Blood Count (Most recent): Lab Results  Component Value Date    WBC 3.8 (L) 03/24/2015   HGB 13.5 03/24/2015   HCT 40.2 03/24/2015   MCV 86.6 03/24/2015   PLT 223 03/24/2015   Chemistry (most recent): Lab Results  Component Value Date   NA 140 01/27/2017   K 3.9 01/27/2017   CL 104 01/27/2017   CO2 21 01/27/2017   BUN 18 01/27/2017   CREATININE 0.88 01/27/2017   Diabetic Labs (most recent): Lab Results  Component Value Date   HGBA1C 10.0 (H) 01/27/2017   HGBA1C 9.3 (H) 05/20/2016   HGBA1C 8.6 11/21/2015    Assessment & Plan:   1. Uncontrolled type 2 diabetes mellitus without complication, with long-term current use of insulin ([place])  Patient came with inadequate glycemic profile, and his recent A1c rising to 10%  Slowly increasing from 8.6%. - He came with inadequate monitoring, and after a long absence from clinic.   - Patient remains at a high risk for more acute and chronic complications of diabetes which include CAD, CVA, CKD, retinopathy, and neuropathy. These are all discussed in detail with the patient.  - I have re-counseled the patient on diet management and  Weight loss  by adopting a carbohydrate restricted / protein rich  Diet. - Patient is advised to stick to a routine mealtimes to eat 3 meals  a day and avoid unnecessary snacks ( to snack only to correct hypoglycemia).  - Suggestion is made for patient to avoid simple carbohydrates   from his diet including Cakes , Desserts, Ice Cream,  Soda (  diet and regular) , Sweet Tea , Candies,  Chips, Cookies, Artificial Sweeteners,   and "Sugar-free" Products .  This will help patient to have stable blood glucose profile and potentially avoid unintended  Weight gain.  - The patient has been  scheduled with Jearld Fenton, RDN, CDE for individualized DM education. - I have approached patient with the following individualized plan to manage diabetes and patient agrees.  - Benjamin Flynn  Has been alarmingly noncompliant and easily  reverses back  to non-adherence.  - I advised him to  resume  monitoring of blood glucose at least twice a day before breakfast and before supper, as well as at any other time as needed. - I dvised him to  resume Humalog 75/25 30 units with breakfast and  30 units with supper for premeal readings of >90.  - he may need basal/bolus insulin based on his readings and his commitments.  - Patient is warned not to take insulin without proper monitoring per orders. -Adjustment parameters are given for hypo and hyperglycemia in writing. -Patient is encouraged to call clinic for blood glucose levels less than 70 or above 300 mg /dl. - he will continue on Metformin 1gm bid, therapeutically suitable for patient.  - He did not like his experiences with therapy with-SGLT2 inhibitors.  - Patient specific target  for A1c; LDL, HDL, Triglycerides, and  Waist Circumference were discussed in detail.  2) BP/HTN: uncontrolled. Continue current medications including  ARB. 3) HPL- Continue Welchol. 4)  Weight/Diet: CDE consult in progress, exercise, and carbohydrates information provided.  5) Chronic Care/Health Maintenance:  -Patient ARB  and encouraged to continue to follow up with Ophthalmology, Podiatrist at least yearly or according to recommendations, and advised to stay away from smoking. I have recommended yearly flu vaccine and pneumonia vaccination at least every 5 years; moderate intensity exercise for up to 150 minutes weekly; and  sleep for at least 7 hours a day.  I advised patient to maintain close follow up with their PCP for primary care needs.  Patient is asked to bring meter and  blood glucose logs during his next visit.   Follow up plan: Return in about 3 months (around 05/07/2017) for meter, and logs.  Glade Lloyd, MD Phone: [phone]  Fax: [phone]   02/04/2017, 2:39 PM

## 2017-02-04 NOTE — Patient Instructions (Signed)

## 2017-03-07 ENCOUNTER — Other Ambulatory Visit: Payer: Self-pay

## 2017-03-07 MED ORDER — INSULIN ASPART PROT & ASPART (70-30 MIX) 100 UNIT/ML PEN: 30.0000 [IU] | PEN_INJECTOR | Freq: Two times a day (BID) | SUBCUTANEOUS | 2 refills | Status: DC

## 2017-04-23 DIAGNOSIS — I10 Essential (primary) hypertension: Secondary | ICD-10-CM | POA: Diagnosis not present

## 2017-04-23 DIAGNOSIS — N400 Enlarged prostate without lower urinary tract symptoms: Secondary | ICD-10-CM | POA: Diagnosis not present

## 2017-04-23 DIAGNOSIS — E119 Type 2 diabetes mellitus without complications: Secondary | ICD-10-CM | POA: Diagnosis not present

## 2017-04-23 DIAGNOSIS — E669 Obesity, unspecified: Secondary | ICD-10-CM | POA: Diagnosis not present

## 2017-04-23 DIAGNOSIS — C61 Malignant neoplasm of prostate: Secondary | ICD-10-CM | POA: Diagnosis not present

## 2017-04-23 DIAGNOSIS — E7800 Pure hypercholesterolemia, unspecified: Secondary | ICD-10-CM | POA: Diagnosis not present

## 2017-04-23 DIAGNOSIS — E1165 Type 2 diabetes mellitus with hyperglycemia: Secondary | ICD-10-CM | POA: Diagnosis not present

## 2017-04-23 DIAGNOSIS — E785 Hyperlipidemia, unspecified: Secondary | ICD-10-CM | POA: Diagnosis not present

## 2017-04-24 ENCOUNTER — Ambulatory Visit (INDEPENDENT_AMBULATORY_CARE_PROVIDER_SITE_OTHER): Payer: BLUE CROSS/BLUE SHIELD | Admitting: Otolaryngology

## 2017-04-24 DIAGNOSIS — H9313 Tinnitus, bilateral: Secondary | ICD-10-CM | POA: Diagnosis not present

## 2017-04-24 DIAGNOSIS — H903 Sensorineural hearing loss, bilateral: Secondary | ICD-10-CM | POA: Diagnosis not present

## 2017-04-25 ENCOUNTER — Other Ambulatory Visit (INDEPENDENT_AMBULATORY_CARE_PROVIDER_SITE_OTHER): Payer: Self-pay | Admitting: Otolaryngology

## 2017-04-25 DIAGNOSIS — IMO0001 Reserved for inherently not codable concepts without codable children: Secondary | ICD-10-CM

## 2017-04-25 DIAGNOSIS — H9042 Sensorineural hearing loss, unilateral, left ear, with unrestricted hearing on the contralateral side: Secondary | ICD-10-CM

## 2017-05-05 ENCOUNTER — Ambulatory Visit (HOSPITAL_COMMUNITY)
Admission: RE | Admit: 2017-05-05 | Discharge: 2017-05-05 | Disposition: A | Discharge status: Home / Self Care | Payer: BLUE CROSS/BLUE SHIELD | Source: Ambulatory Visit | Attending: Otolaryngology | Admitting: Otolaryngology

## 2017-05-05 DIAGNOSIS — H9042 Sensorineural hearing loss, unilateral, left ear, with unrestricted hearing on the contralateral side: Secondary | ICD-10-CM | POA: Insufficient documentation

## 2017-05-05 DIAGNOSIS — H547 Unspecified visual loss: Secondary | ICD-10-CM | POA: Diagnosis not present

## 2017-05-05 DIAGNOSIS — IMO0001 Reserved for inherently not codable concepts without codable children: Secondary | ICD-10-CM

## 2017-05-05 LAB — POCT I-STAT CREATININE: CREATININE: 1 mg/dL (ref 0.61–1.24)

## 2017-05-05 MED ORDER — GADOBENATE DIMEGLUMINE 529 MG/ML IV SOLN
20.0000 mL | Freq: Once | INTRAVENOUS | Status: AC | PRN
  Administered 2017-05-05: 20 mL via INTRAVENOUS

## 2017-05-12 ENCOUNTER — Encounter: Payer: Self-pay | Admitting: "Endocrinology

## 2017-05-12 ENCOUNTER — Ambulatory Visit (INDEPENDENT_AMBULATORY_CARE_PROVIDER_SITE_OTHER): Payer: BLUE CROSS/BLUE SHIELD | Admitting: "Endocrinology

## 2017-05-12 VITALS — BP 165/73 | HR 75 | Ht 71.0 in | Wt 240.0 lb

## 2017-05-12 DIAGNOSIS — E6609 Other obesity due to excess calories: Secondary | ICD-10-CM

## 2017-05-12 DIAGNOSIS — Z794 Long term (current) use of insulin: Secondary | ICD-10-CM | POA: Diagnosis not present

## 2017-05-12 DIAGNOSIS — I10 Essential (primary) hypertension: Secondary | ICD-10-CM | POA: Diagnosis not present

## 2017-05-12 DIAGNOSIS — Z91199 Patient's noncompliance with other medical treatment and regimen due to unspecified reason: Secondary | ICD-10-CM

## 2017-05-12 DIAGNOSIS — Z6832 Body mass index (BMI) 32.0-32.9, adult: Secondary | ICD-10-CM | POA: Diagnosis not present

## 2017-05-12 DIAGNOSIS — E1165 Type 2 diabetes mellitus with hyperglycemia: Secondary | ICD-10-CM

## 2017-05-12 DIAGNOSIS — E118 Type 2 diabetes mellitus with unspecified complications: Secondary | ICD-10-CM | POA: Diagnosis not present

## 2017-05-12 DIAGNOSIS — IMO0002 Reserved for concepts with insufficient information to code with codable children: Secondary | ICD-10-CM

## 2017-05-12 DIAGNOSIS — Z9119 Patient's noncompliance with other medical treatment and regimen: Secondary | ICD-10-CM | POA: Diagnosis not present

## 2017-05-12 MED ORDER — INSULIN ASPART PROT & ASPART (70-30 MIX) 100 UNIT/ML PEN: 30.0000 [IU] | PEN_INJECTOR | Freq: Two times a day (BID) | SUBCUTANEOUS | 2 refills | Status: DC

## 2017-05-12 MED ORDER — GLUCOSE BLOOD VI STRP: ORAL_STRIP | 5 refills | Status: DC

## 2017-05-12 MED ORDER — METFORMIN HCL 1000 MG PO TABS: 1000.0000 mg | ORAL_TABLET | Freq: Two times a day (BID) | ORAL | 0 refills | Status: DC

## 2017-05-12 NOTE — Progress Notes (Signed)
Subjective:    Patient ID: Benjamin Flynn, male    DOB: 03-20-1951,    Past Medical History:  Diagnosis Date  . Arthritis   . Asthma   . Diabetes ([place])   . GERD (gastroesophageal reflux disease)   . HTN (hypertension)    Past Surgical History:  Procedure Laterality Date  . COLONOSCOPY  2002   Dr. Gala Romney. Normal.  . COLONOSCOPY WITH ESOPHAGOGASTRODUODENOSCOPY (EGD) N/A 02/10/2013   [id number] hiatal hernia. Minimally abnormal-appearing gastric mucosa-status post biopsy. PATH H. PYLORI GASTRITIS:Colonic polyp-TUBULAR ADENOMA   Social History   Social History  . Marital status: Married    Spouse name: N/A  . Number of children: 2  . Years of education: N/A   Occupational History  .  [employer]   Social History Main Topics  . Smoking status: Never Smoker  . Smokeless tobacco: Never Used     Comment: Never smoked  . Alcohol use No     Comment: former user  . Drug use: No  . Sexual activity: Yes    Birth control/ protection: None   Other Topics Concern  . None   Social History Narrative  . None   Outpatient Encounter Prescriptions as of 05/12/2017  Medication Sig  . amLODipine (NORVASC) 5 MG tablet Take 5 mg by mouth daily.  Marland Kitchen CARAFATE 1 GM/10ML suspension TAKE  10 ML BY MOUTH 4 TIMES DAILY AS NEEDED  . esomeprazole (NEXIUM) 40 MG capsule Take 1 capsule (40 mg total) by mouth every morning.  Marland Kitchen glucose blood (ONE TOUCH ULTRA TEST) test strip USE TO CHECK BLOOD SUGAR TWICE DAILY. E11.65  . insulin aspart protamine - aspart (NOVOLOG MIX 70/30 FLEXPEN) (70-30) 100 UNIT/ML FlexPen Inject 0.3 mLs (30 Units total) into the skin 2 (two) times daily.  Marland Kitchen losartan (COZAAR) 25 MG tablet Take 25 mg by mouth daily. Patient unsure of dose.  . metFORMIN (GLUCOPHAGE) 1000 MG tablet Take 1 tablet (1,000 mg total) by mouth 2 (two) times daily with a meal.  . NOVOFINE 30G X 8 MM MISC USE TWICE DAILY  . RABEprazole (ACIPHEX) 20 MG tablet Take 1 tablet (20 mg total) by mouth  daily. 30 minutes before breakfast.  . tadalafil (CIALIS) 5 MG tablet Take 5 mg by mouth daily as needed for erectile dysfunction.  Marland Kitchen ULTICARE SHORT PEN NEEDLES 31G X 8 MM MISC USE TO INJECT INSULIN TWICE DAILY  . [DISCONTINUED] insulin aspart protamine - aspart (NOVOLOG MIX 70/30 FLEXPEN) (70-30) 100 UNIT/ML FlexPen Inject 0.3 mLs (30 Units total) into the skin 2 (two) times daily.  . [DISCONTINUED] Insulin Lispro Prot & Lispro (HUMALOG MIX 75/25 KWIKPEN) (75-25) 100 UNIT/ML Kwikpen INJECT 30 UNITS SUBCUTANEOUSLY TWICE DAILY WITH BREAKFAST AND SUPPER.  . [DISCONTINUED] metFORMIN (GLUCOPHAGE) 1000 MG tablet Take 1 tablet (1,000 mg total) by mouth 2 (two) times daily with a meal.  . [DISCONTINUED] ONE TOUCH ULTRA TEST test strip USE TO CHECK BLOOD SUGAR TWICE DAILY.   No facility-administered encounter medications on file as of 05/12/2017.    ALLERGIES: No Known Allergies  Diabetes  He presents for his follow-up diabetic visit. He has type 2 diabetes mellitus. His disease course has been worsening. There are no hypoglycemic associated symptoms. Pertinent negatives for hypoglycemia include no confusion, headaches, pallor or seizures. Associated symptoms include polydipsia and polyuria. Pertinent negatives for diabetes include no chest pain, no fatigue, no polyphagia and no weakness. There are no hypoglycemic complications. Symptoms are worsening. There are no diabetic complications.  Risk factors for coronary artery disease include dyslipidemia, diabetes mellitus, hypertension, male sex, obesity and sedentary lifestyle. He is compliant with treatment none of the time (He did not comply with insulin instructions. He did not monitor enough). His weight is stable. He rarely participates in exercise. Blood glucose monitoring compliance is poor. His overall blood glucose range is >200 mg/dl. (He came with random, inadequate monitoring 0-1 time per day , averaging about 100.) An ACE inhibitor/angiotensin II  receptor blocker is being taken.  Hypertension  This is a chronic problem. The current episode started more than 1 year ago. Pertinent negatives include no chest pain, headaches, neck pain, palpitations or shortness of breath. Past treatments include alpha 1 blockers.  Hyperlipidemia  This is a chronic problem. The current episode started more than 1 year ago. Pertinent negatives include no chest pain, myalgias or shortness of breath.     Review of Systems  Constitutional: Negative for fatigue and unexpected weight change.  HENT: Negative for dental problem, mouth sores and trouble swallowing.   Eyes: Negative for visual disturbance.  Respiratory: Negative for cough, choking, chest tightness, shortness of breath and wheezing.   Cardiovascular: Negative for chest pain, palpitations and leg swelling.  Gastrointestinal: Negative for abdominal distention, abdominal pain, constipation, diarrhea, nausea and vomiting.  Endocrine: Positive for polydipsia and polyuria. Negative for polyphagia.  Genitourinary: Negative for dysuria, flank pain, hematuria and urgency.  Musculoskeletal: Negative for back pain, gait problem, myalgias and neck pain.  Skin: Negative for pallor, rash and wound.  Neurological: Negative for seizures, syncope, weakness, numbness and headaches.  Psychiatric/Behavioral: Negative.  Negative for confusion and dysphoric mood.    Objective:    BP (!) 165/73   Pulse 75   Ht 5\' 11"  (1.803 m)   Wt 240 lb (108.9 kg)   BMI 33.47 kg/m   Wt Readings from Last 3 Encounters:  05/12/17 240 lb (108.9 kg)  02/04/17 234 lb (106.1 kg)  07/02/16 236 lb (107 kg)    Physical Exam  Constitutional: He is oriented to person, place, and time. He appears well-developed and well-nourished. He is cooperative. No distress.  HENT:  Head: Normocephalic and atraumatic.  Eyes: EOM are normal.  Neck: Normal range of motion. Neck supple. No tracheal deviation present. No thyromegaly present.   Cardiovascular: Normal rate, S1 normal, S2 normal and normal heart sounds.  Exam reveals no gallop.   No murmur heard. Pulses:      Dorsalis pedis pulses are 1+ on the right side, and 1+ on the left side.       Posterior tibial pulses are 1+ on the right side, and 1+ on the left side.  Pulmonary/Chest: Breath sounds normal. No respiratory distress. He has no wheezes.  Abdominal: Soft. Bowel sounds are normal. He exhibits no distension. There is no tenderness. There is no guarding and no CVA tenderness.  Musculoskeletal: He exhibits no edema.       Right shoulder: He exhibits no swelling and no deformity.  Neurological: He is alert and oriented to person, place, and time. He has normal strength and normal reflexes. No cranial nerve deficit or sensory deficit. Gait normal.  Skin: Skin is warm and dry. No rash noted. No cyanosis. Nails show no clubbing.  Psychiatric: He has a normal mood and affect. His speech is normal and behavior is normal. Judgment and thought content normal. Cognition and memory are normal.    Results for orders placed or performed during the hospital encounter of 05/05/17  I-STAT creatinine  Result Value Ref Range   Creatinine, Ser 1.00 0.61 - 1.24 mg/dL   Complete Blood Count (Most recent): Lab Results  Component Value Date   WBC 3.8 (L) 03/24/2015   HGB 13.5 03/24/2015   HCT 40.2 03/24/2015   MCV 86.6 03/24/2015   PLT 223 03/24/2015   Chemistry (most recent): Lab Results  Component Value Date   NA 140 01/27/2017   K 3.9 01/27/2017   CL 104 01/27/2017   CO2 21 01/27/2017   BUN 18 01/27/2017   CREATININE 1.00 05/05/2017   Diabetic Labs (most recent): Lab Results  Component Value Date   HGBA1C 10.0 (H) 01/27/2017   HGBA1C 9.3 (H) 05/20/2016   HGBA1C 8.6 11/21/2015    Assessment & Plan:   1. Uncontrolled type 2 diabetes mellitus without complication, with long-term current use of insulin ([place])  Patient came with inadequate monitoring of glycemic  profile, and his recent A1c staying high at 10% .   - Patient remains at a high risk for more acute and chronic complications of diabetes which include CAD, CVA, CKD, retinopathy, and neuropathy. These are all discussed in detail with the patient.  - I have re-counseled the patient on diet management and  Weight loss  by adopting a carbohydrate restricted / protein rich  Diet. - Patient is advised to stick to a routine mealtimes to eat 3 meals  a day and avoid unnecessary snacks ( to snack only to correct hypoglycemia).  -  Suggestion is made for him to avoid simple carbohydrates  from his diet including Cakes, Sweet Desserts / Pastries, Ice Cream, Soda (diet and regular), Sweet Tea, Candies, Chips, Cookies, Store Bought Juices, Alcohol in Excess of  1-2 drinks a day, Artificial Sweeteners, and "Sugar-free" Products. This will help patient to have stable blood glucose profile and potentially avoid unintended weight gain.  - I have approached patient with the following individualized plan to manage diabetes and patient agrees.  - Benjamin Flynn  has been alarmingly noncompliant and easily  reverses back  to non-adherence.  - I advised him to resume  monitoring of blood glucose at least twice a day before breakfast and before supper, as well as at any other time as needed. - I dvised him to continue NovoLog 70/30 30 units with breakfast and  30 units with supper for premeal readings of >90.  He is advised to continue strict monitoring of blood glucose at least 2 times a day-before breakfast and before supper. - he may need basal/bolus insulin based on his readings and his commitments.  - Patient is warned not to take insulin without proper monitoring per orders.  -Patient is encouraged to call clinic for blood glucose levels less than 70 or above 300 mg /dl. - he will continue on Metformin 1000 mg p.o. twice daily with meals, therapeutically suitable for patient.  - He did not like his experiences with  therapy with-SGLT2 inhibitors.  - Patient specific target  for A1c; LDL, HDL, Triglycerides, and  Waist Circumference were discussed in detail.  2) BP/HTN: Uncontrolled that he is not consistent taking his medications.  I advised him to continue current medications including ARB. 3) HPL- Continue Welchol. 4)  Weight/Diet: CDE consult in progress, exercise, and carbohydrates information provided.  5) Chronic Care/Health Maintenance:  -Patient ARB  and encouraged to continue to follow up with Ophthalmology, Podiatrist at least yearly or according to recommendations, and advised to stay away from smoking. I have recommended yearly flu  vaccine and pneumonia vaccination at least every 5 years; moderate intensity exercise for up to 150 minutes weekly; and  sleep for at least 7 hours a day.  I advised patient to maintain close follow up with his PCP for primary care needs. - Time spent with the patient: 25 min, of which >50% was spent in reviewing his sugar logs , discussing his hypo- and hyper-glycemic episodes, reviewing his current and  previous labs and insulin doses and developing a plan to avoid hypo- and hyper-glycemia.     Follow up plan: Return in about 3 months (around 08/12/2017) for follow up with pre-visit labs, meter, and logs.  Glade Lloyd, MD Phone: [phone]  Fax: [phone]  -  This note was partially dictated with voice recognition software. Similar sounding words can be transcribed inadequately or may not  be corrected upon review.  05/12/2017, 11:37 AM

## 2017-05-12 NOTE — Patient Instructions (Signed)

## 2017-05-15 ENCOUNTER — Ambulatory Visit (INDEPENDENT_AMBULATORY_CARE_PROVIDER_SITE_OTHER): Payer: BLUE CROSS/BLUE SHIELD | Admitting: Otolaryngology

## 2017-05-15 DIAGNOSIS — H9311 Tinnitus, right ear: Secondary | ICD-10-CM

## 2017-05-15 DIAGNOSIS — H9041 Sensorineural hearing loss, unilateral, right ear, with unrestricted hearing on the contralateral side: Secondary | ICD-10-CM | POA: Diagnosis not present

## 2017-05-31 ENCOUNTER — Other Ambulatory Visit: Payer: Self-pay | Admitting: Gastroenterology

## 2017-07-25 DIAGNOSIS — E785 Hyperlipidemia, unspecified: Secondary | ICD-10-CM | POA: Diagnosis not present

## 2017-07-25 DIAGNOSIS — N402 Nodular prostate without lower urinary tract symptoms: Secondary | ICD-10-CM | POA: Diagnosis not present

## 2017-07-25 DIAGNOSIS — E1165 Type 2 diabetes mellitus with hyperglycemia: Secondary | ICD-10-CM | POA: Diagnosis not present

## 2017-07-25 DIAGNOSIS — I10 Essential (primary) hypertension: Secondary | ICD-10-CM | POA: Diagnosis not present

## 2017-08-12 DIAGNOSIS — N401 Enlarged prostate with lower urinary tract symptoms: Secondary | ICD-10-CM | POA: Diagnosis not present

## 2017-08-12 DIAGNOSIS — R350 Frequency of micturition: Secondary | ICD-10-CM | POA: Diagnosis not present

## 2017-08-12 DIAGNOSIS — R972 Elevated prostate specific antigen [PSA]: Secondary | ICD-10-CM | POA: Diagnosis not present

## 2017-08-13 ENCOUNTER — Ambulatory Visit: Payer: BLUE CROSS/BLUE SHIELD | Admitting: "Endocrinology

## 2017-10-01 ENCOUNTER — Ambulatory Visit: Payer: BLUE CROSS/BLUE SHIELD | Admitting: "Endocrinology

## 2017-10-17 DIAGNOSIS — E1165 Type 2 diabetes mellitus with hyperglycemia: Secondary | ICD-10-CM | POA: Diagnosis not present

## 2017-10-17 DIAGNOSIS — E118 Type 2 diabetes mellitus with unspecified complications: Secondary | ICD-10-CM | POA: Diagnosis not present

## 2017-10-17 DIAGNOSIS — Z794 Long term (current) use of insulin: Secondary | ICD-10-CM | POA: Diagnosis not present

## 2017-10-18 LAB — COMPLETE METABOLIC PANEL WITH GFR
AG Ratio: 1.8 (calc) (ref 1.0–2.5)
ALBUMIN MSPROF: 3.9 g/dL (ref 3.6–5.1)
ALKALINE PHOSPHATASE (APISO): 59 U/L (ref 40–115)
ALT: 13 U/L (ref 9–46)
AST: 11 U/L (ref 10–35)
BILIRUBIN TOTAL: 0.6 mg/dL (ref 0.2–1.2)
BUN: 20 mg/dL (ref 7–25)
CHLORIDE: 106 mmol/L (ref 98–110)
CO2: 29 mmol/L (ref 20–32)
Calcium: 9.1 mg/dL (ref 8.6–10.3)
Creat: 0.97 mg/dL (ref 0.70–1.25)
GFR, Est African American: 93 mL/min/{1.73_m2} (ref >=60)
GFR, Est Non African American: 80 mL/min/{1.73_m2} (ref >=60)
GLUCOSE: 198 mg/dL — AB (ref 65–99)
Globulin: 2.2 g/dL (calc) (ref 1.9–3.7)
Potassium: 3.9 mmol/L (ref 3.5–5.3)
Sodium: 139 mmol/L (ref 135–146)
Total Protein: 6.1 g/dL (ref 6.1–8.1)

## 2017-10-18 LAB — HEMOGLOBIN A1C
EAG (MMOL/L): 13.3 (calc)
HEMOGLOBIN A1C: 10 %{Hb} — AB (ref <5.7)
Mean Plasma Glucose: 240 (calc)

## 2017-10-20 ENCOUNTER — Ambulatory Visit: Payer: BLUE CROSS/BLUE SHIELD | Admitting: "Endocrinology

## 2017-10-20 ENCOUNTER — Encounter: Payer: Self-pay | Admitting: "Endocrinology

## 2017-10-20 VITALS — BP 136/77 | HR 88 | Ht 71.0 in | Wt 245.0 lb

## 2017-10-20 DIAGNOSIS — E1165 Type 2 diabetes mellitus with hyperglycemia: Secondary | ICD-10-CM | POA: Diagnosis not present

## 2017-10-20 DIAGNOSIS — E118 Type 2 diabetes mellitus with unspecified complications: Secondary | ICD-10-CM | POA: Diagnosis not present

## 2017-10-20 DIAGNOSIS — E782 Mixed hyperlipidemia: Secondary | ICD-10-CM

## 2017-10-20 DIAGNOSIS — IMO0002 Reserved for concepts with insufficient information to code with codable children: Secondary | ICD-10-CM

## 2017-10-20 DIAGNOSIS — Z794 Long term (current) use of insulin: Secondary | ICD-10-CM

## 2017-10-20 DIAGNOSIS — I10 Essential (primary) hypertension: Secondary | ICD-10-CM | POA: Diagnosis not present

## 2017-10-20 MED ORDER — METFORMIN HCL 1000 MG PO TABS: 1000.0000 mg | ORAL_TABLET | Freq: Two times a day (BID) | ORAL | 2 refills | Status: AC

## 2017-10-20 MED ORDER — INSULIN ASPART PROT & ASPART (70-30 MIX) 100 UNIT/ML PEN: 30.0000 [IU] | PEN_INJECTOR | Freq: Two times a day (BID) | SUBCUTANEOUS | 2 refills | Status: DC

## 2017-10-20 MED ORDER — ATORVASTATIN CALCIUM 20 MG PO TABS: 20.0000 mg | ORAL_TABLET | Freq: Every day | ORAL | 2 refills | Status: DC

## 2017-10-20 MED ORDER — GLUCOSE BLOOD VI STRP: ORAL_STRIP | 5 refills | Status: DC

## 2017-10-20 NOTE — Progress Notes (Signed)
Subjective:    Patient ID: Benjamin Flynn, male    DOB: 1951-01-14,    Past Medical History:  Diagnosis Date  . Arthritis   . Asthma   . Diabetes ([place])   . GERD (gastroesophageal reflux disease)   . HTN (hypertension)    Past Surgical History:  Procedure Laterality Date  . COLONOSCOPY  2002   Dr. Gala Romney. Normal.  . COLONOSCOPY WITH ESOPHAGOGASTRODUODENOSCOPY (EGD) N/A 02/10/2013   KDT:OIZTI hiatal hernia. Minimally abnormal-appearing gastric mucosa-status post biopsy. PATH H. PYLORI GASTRITIS:Colonic polyp-TUBULAR ADENOMA   Social History   Socioeconomic History  . Marital status: Married    Spouse name: Not on file  . Number of children: 2  . Years of education: Not on file  . Highest education level: Not on file  Occupational History    Employer: [employer]  Social Needs  . Financial resource strain: Not on file  . Food insecurity:    Worry: Not on file    Inability: Not on file  . Transportation needs:    Medical: Not on file    Non-medical: Not on file  Tobacco Use  . Smoking status: Never Smoker  . Smokeless tobacco: Never Used  . Tobacco comment: Never smoked  Substance and Sexual Activity  . Alcohol use: No    Alcohol/week: 0.0 oz    Comment: former user  . Drug use: No  . Sexual activity: Yes    Birth control/protection: None  Lifestyle  . Physical activity:    Days per week: Not on file    Minutes per session: Not on file  . Stress: Not on file  Relationships  . Social connections:    Talks on phone: Not on file    Gets together: Not on file    Attends religious service: Not on file    Active member of club or organization: Not on file    Attends meetings of clubs or organizations: Not on file    Relationship status: Not on file  Other Topics Concern  . Not on file  Social History Narrative  . Not on file   Outpatient Encounter Medications as of 10/20/2017  Medication Sig  . amLODipine (NORVASC) 5 MG tablet Take 5 mg by mouth  daily.  Marland Kitchen atorvastatin (LIPITOR) 20 MG tablet Take 1 tablet (20 mg total) by mouth daily.  Marland Kitchen CARAFATE 1 GM/10ML suspension TAKE  10 ML BY MOUTH 4 TIMES DAILY AS NEEDED  . esomeprazole (NEXIUM) 40 MG capsule TAKE ONE CAPSULE BY MOUTH IN THE MORNING  . glucose blood (ONE TOUCH ULTRA TEST) test strip USE TO CHECK BLOOD SUGAR TWICE DAILY. E11.65  . insulin aspart protamine - aspart (NOVOLOG MIX 70/30 FLEXPEN) (70-30) 100 UNIT/ML FlexPen Inject 0.3 mLs (30 Units total) into the skin 2 (two) times daily.  Marland Kitchen losartan (COZAAR) 25 MG tablet Take 25 mg by mouth daily. Patient unsure of dose.  . metFORMIN (GLUCOPHAGE) 1000 MG tablet Take 1 tablet (1,000 mg total) by mouth 2 (two) times daily with a meal.  . NOVOFINE 30G X 8 MM MISC USE TWICE DAILY  . RABEprazole (ACIPHEX) 20 MG tablet Take 1 tablet (20 mg total) by mouth daily. 30 minutes before breakfast.  . tadalafil (CIALIS) 5 MG tablet Take 5 mg by mouth daily as needed for erectile dysfunction.  Marland Kitchen ULTICARE SHORT PEN NEEDLES 31G X 8 MM MISC USE TO INJECT INSULIN TWICE DAILY  . [DISCONTINUED] glucose blood (ONE TOUCH ULTRA TEST) test strip  USE TO CHECK BLOOD SUGAR TWICE DAILY. E11.65  . [DISCONTINUED] insulin aspart protamine - aspart (NOVOLOG MIX 70/30 FLEXPEN) (70-30) 100 UNIT/ML FlexPen Inject 0.3 mLs (30 Units total) into the skin 2 (two) times daily.  . [DISCONTINUED] metFORMIN (GLUCOPHAGE) 1000 MG tablet Take 1 tablet (1,000 mg total) by mouth 2 (two) times daily with a meal.   No facility-administered encounter medications on file as of 10/20/2017.    ALLERGIES: No Known Allergies  Diabetes  He presents for his follow-up diabetic visit. He has type 2 diabetes mellitus. His disease course has been worsening. There are no hypoglycemic associated symptoms. Pertinent negatives for hypoglycemia include no confusion, headaches, pallor or seizures. Associated symptoms include polydipsia and polyuria. Pertinent negatives for diabetes include no chest  pain, no fatigue, no polyphagia and no weakness. There are no hypoglycemic complications. Symptoms are worsening. There are no diabetic complications. Risk factors for coronary artery disease include dyslipidemia, diabetes mellitus, hypertension, male sex, obesity and sedentary lifestyle. He is compliant with treatment none of the time (He did not comply with insulin instructions. He did not monitor enough). His weight is increasing steadily. He rarely participates in exercise. Blood glucose monitoring compliance is poor. His overall blood glucose range is >200 mg/dl. (He came with random, inadequate monitoring 0-1 time per day , averaging 209, his recent labs show A1c of 10%.  ) An ACE inhibitor/angiotensin II receptor blocker is being taken.  Hypertension  This is a chronic problem. The current episode started more than 1 year ago. Pertinent negatives include no chest pain, headaches, neck pain, palpitations or shortness of breath. Past treatments include alpha 1 blockers.  Hyperlipidemia  This is a chronic problem. The current episode started more than 1 year ago. Pertinent negatives include no chest pain, myalgias or shortness of breath.     Review of Systems  Constitutional: Negative for fatigue and unexpected weight change.  HENT: Negative for dental problem, mouth sores and trouble swallowing.   Eyes: Negative for visual disturbance.  Respiratory: Negative for cough, choking, chest tightness, shortness of breath and wheezing.   Cardiovascular: Negative for chest pain, palpitations and leg swelling.  Gastrointestinal: Negative for abdominal distention, abdominal pain, constipation, diarrhea, nausea and vomiting.  Endocrine: Positive for polydipsia and polyuria. Negative for polyphagia.  Genitourinary: Negative for dysuria, flank pain, hematuria and urgency.  Musculoskeletal: Negative for back pain, gait problem, myalgias and neck pain.  Skin: Negative for pallor, rash and wound.   Neurological: Negative for seizures, syncope, weakness, numbness and headaches.  Psychiatric/Behavioral: Negative.  Negative for confusion and dysphoric mood.    Objective:    BP 136/77   Pulse 88   Ht 5\' 11"  (1.803 m)   Wt 245 lb (111.1 kg)   BMI 34.17 kg/m   Wt Readings from Last 3 Encounters:  10/20/17 245 lb (111.1 kg)  05/12/17 240 lb (108.9 kg)  02/04/17 234 lb (106.1 kg)    Physical Exam  Constitutional: He is oriented to person, place, and time. He appears well-developed and well-nourished. He is cooperative. No distress.  HENT:  Head: Normocephalic and atraumatic.  Eyes: EOM are normal.  Neck: Normal range of motion. Neck supple. No tracheal deviation present. No thyromegaly present.  Cardiovascular: Normal rate, S1 normal, S2 normal and normal heart sounds. Exam reveals no gallop.  No murmur heard. Pulses:      Dorsalis pedis pulses are 1+ on the right side, and 1+ on the left side.       Posterior  tibial pulses are 1+ on the right side, and 1+ on the left side.  Pulmonary/Chest: Breath sounds normal. No respiratory distress. He has no wheezes.  Abdominal: Soft. Bowel sounds are normal. He exhibits no distension. There is no tenderness. There is no guarding and no CVA tenderness.  Musculoskeletal: He exhibits no edema.       Right shoulder: He exhibits no swelling and no deformity.  Neurological: He is alert and oriented to person, place, and time. He has normal strength and normal reflexes. No cranial nerve deficit or sensory deficit. Gait normal.  Skin: Skin is warm and dry. No rash noted. No cyanosis. Nails show no clubbing.  Psychiatric: He has a normal mood and affect. His speech is normal and behavior is normal. Judgment and thought content normal. Cognition and memory are normal.    Results for orders placed or performed in visit on 05/12/17  COMPLETE METABOLIC PANEL WITH GFR  Result Value Ref Range   Glucose, Bld 198 (H) 65 - 99 mg/dL   BUN 20 7 - 25 mg/dL    Creat 0.97 0.70 - 1.25 mg/dL   GFR, Est Non African American 80 > OR = 60 mL/min/1.57m2   GFR, Est African American 93 > OR = 60 mL/min/1.18m2   BUN/Creatinine Ratio NOT APPLICABLE 6 - 22 (calc)   Sodium 139 135 - 146 mmol/L   Potassium 3.9 3.5 - 5.3 mmol/L   Chloride 106 98 - 110 mmol/L   CO2 29 20 - 32 mmol/L   Calcium 9.1 8.6 - 10.3 mg/dL   Total Protein 6.1 6.1 - 8.1 g/dL   Albumin 3.9 3.6 - 5.1 g/dL   Globulin 2.2 1.9 - 3.7 g/dL (calc)   AG Ratio 1.8 1.0 - 2.5 (calc)   Total Bilirubin 0.6 0.2 - 1.2 mg/dL   Alkaline phosphatase (APISO) 59 40 - 115 U/L   AST 11 10 - 35 U/L   ALT 13 9 - 46 U/L  Hemoglobin A1c  Result Value Ref Range   Hgb A1c MFr Bld 10.0 (H) <5.7 % of total Hgb   Mean Plasma Glucose 240 (calc)   eAG (mmol/L) 13.3 (calc)   Complete Blood Count (Most recent): Lab Results  Component Value Date   WBC 3.8 (L) 03/24/2015   HGB 13.5 03/24/2015   HCT 40.2 03/24/2015   MCV 86.6 03/24/2015   PLT 223 03/24/2015   Chemistry (most recent): Lab Results  Component Value Date   NA 139 10/17/2017   K 3.9 10/17/2017   CL 106 10/17/2017   CO2 29 10/17/2017   BUN 20 10/17/2017   CREATININE 0.97 10/17/2017   Diabetic Labs (most recent): Lab Results  Component Value Date   HGBA1C 10.0 (H) 10/17/2017   HGBA1C 10.0 (H) 01/27/2017   HGBA1C 9.3 (H) 05/20/2016    Assessment & Plan:   1. Uncontrolled type 2 diabetes mellitus without complication, with long-term current use of insulin ([place])  After missing his last appointment, patient came with only with his meter showing rare and random blood glucose monitoring, averaging 209 g/dL.  His most recent labs show A1c still high at 10%.   - Patient remains at a high risk for more acute and chronic complications of diabetes which include CAD, CVA, CKD, retinopathy, and neuropathy. These are all discussed in detail with the patient.  - I have re-counseled the patient on diet management and  Weight loss  by adopting a  carbohydrate restricted / protein rich  Diet. - Patient  is advised to stick to a routine mealtimes to eat 3 meals  a day and avoid unnecessary snacks ( to snack only to correct hypoglycemia). -  Suggestion is made for him to avoid simple carbohydrates  from his diet including Cakes, Sweet Desserts / Pastries, Ice Cream, Soda (diet and regular), Sweet Tea, Candies, Chips, Cookies, Store Bought Juices, Alcohol in Excess of  1-2 drinks a day, Artificial Sweeteners, and "Sugar-free" Products. This will help patient to have stable blood glucose profile and potentially avoid unintended weight gain.  - I have approached patient with the following individualized plan to manage diabetes and patient agrees.  - Benjamin Flynn  has not engaged properly for optimal control of diabetes.   -Based on his current glycemic burden, he would require intensive treatment with basal/bolus insulin, however, he was too overwhelmed to give 4 insulin injections daily. - I advised him to resume  monitoring of blood glucose at least twice a day before breakfast and before supper, as well as at any other time as needed. - I dvised him to resume  NovoLog 70/30 30 units with breakfast and  30 units with supper for premeal readings of >90.  - Patient is warned not to take insulin without proper monitoring per orders.  -Patient is encouraged to call clinic for blood glucose levels less than 70 or above 300 mg /dl. - he will continue on Metformin 1000 mg p.o. twice daily with meals, therapeutically suitable for patient.  - He did not like his experiences with therapy with-SGLT2 inhibitors.  - Patient specific target  for A1c; LDL, HDL, Triglycerides, and  Waist Circumference were discussed in detail.  2) BP/HTN: His blood pressure is controlled to target.    I advised him to continue current medications including ARB. 3) HPL-controlled with LDL of 120, I discussed and initiated atorvastatin 20 mg p.o. q. at bedtime.   4)  Weight/Diet:  CDE consult in progress, exercise, and carbohydrates information provided.  5) Chronic Care/Health Maintenance:  -Patient ARB  and encouraged to continue to follow up with Ophthalmology, Podiatrist at least yearly or according to recommendations, and advised to stay away from smoking. I have recommended yearly flu vaccine and pneumonia vaccination at least every 5 years; moderate intensity exercise for up to 150 minutes weekly; and  sleep for at least 7 hours a day.  I advised patient to maintain close follow up with his PCP for primary care needs.  - Time spent with the patient: 25 min, of which >50% was spent in reviewing his blood glucose logs , discussing his hypo- and hyper-glycemic episodes, reviewing his current and  previous labs and insulin doses and developing a plan to avoid hypo- and hyper-glycemia. Please refer to Patient Instructions for Blood Glucose Monitoring and Insulin/Medications Dosing Guide"  in media tab for additional information. Benjamin Flynn participated in the discussions, expressed understanding, and voiced agreement with the above plans.  All questions were answered to his satisfaction. he is encouraged to contact clinic should he have any questions or concerns prior to his return visit.   Follow up plan: Return in about 3 months (around 01/19/2018) for meter, and logs.  Glade Lloyd, MD Phone: [phone]  Fax: [phone]  -  This note was partially dictated with voice recognition software. Similar sounding words can be transcribed inadequately or may not  be corrected upon review.  10/20/2017, 11:04 AM

## 2017-11-11 ENCOUNTER — Other Ambulatory Visit: Payer: Self-pay | Admitting: "Endocrinology

## 2017-11-12 DIAGNOSIS — R972 Elevated prostate specific antigen [PSA]: Secondary | ICD-10-CM | POA: Diagnosis not present

## 2017-11-12 DIAGNOSIS — N5201 Erectile dysfunction due to arterial insufficiency: Secondary | ICD-10-CM | POA: Diagnosis not present

## 2017-11-12 DIAGNOSIS — R3915 Urgency of urination: Secondary | ICD-10-CM | POA: Diagnosis not present

## 2017-11-12 DIAGNOSIS — N401 Enlarged prostate with lower urinary tract symptoms: Secondary | ICD-10-CM | POA: Diagnosis not present

## 2017-12-24 DIAGNOSIS — E1165 Type 2 diabetes mellitus with hyperglycemia: Secondary | ICD-10-CM | POA: Diagnosis not present

## 2017-12-24 DIAGNOSIS — N402 Nodular prostate without lower urinary tract symptoms: Secondary | ICD-10-CM | POA: Diagnosis not present

## 2017-12-24 DIAGNOSIS — I10 Essential (primary) hypertension: Secondary | ICD-10-CM | POA: Diagnosis not present

## 2018-01-19 ENCOUNTER — Ambulatory Visit: Payer: BLUE CROSS/BLUE SHIELD | Admitting: "Endocrinology

## 2018-01-20 ENCOUNTER — Encounter: Payer: Self-pay | Admitting: "Endocrinology

## 2018-01-26 ENCOUNTER — Encounter: Payer: Self-pay | Admitting: Internal Medicine

## 2018-02-12 DIAGNOSIS — E1140 Type 2 diabetes mellitus with diabetic neuropathy, unspecified: Secondary | ICD-10-CM | POA: Diagnosis not present

## 2018-02-12 DIAGNOSIS — M79672 Pain in left foot: Secondary | ICD-10-CM | POA: Diagnosis not present

## 2018-02-12 DIAGNOSIS — M79671 Pain in right foot: Secondary | ICD-10-CM | POA: Diagnosis not present

## 2018-02-27 DIAGNOSIS — K219 Gastro-esophageal reflux disease without esophagitis: Secondary | ICD-10-CM | POA: Diagnosis not present

## 2018-02-27 DIAGNOSIS — I10 Essential (primary) hypertension: Secondary | ICD-10-CM | POA: Diagnosis not present

## 2018-02-27 DIAGNOSIS — E1165 Type 2 diabetes mellitus with hyperglycemia: Secondary | ICD-10-CM | POA: Diagnosis not present

## 2018-03-11 ENCOUNTER — Other Ambulatory Visit: Payer: Self-pay | Admitting: "Endocrinology

## 2018-04-20 DIAGNOSIS — B351 Tinea unguium: Secondary | ICD-10-CM | POA: Diagnosis not present

## 2018-04-20 DIAGNOSIS — E1165 Type 2 diabetes mellitus with hyperglycemia: Secondary | ICD-10-CM | POA: Diagnosis not present

## 2018-04-20 DIAGNOSIS — I10 Essential (primary) hypertension: Secondary | ICD-10-CM | POA: Diagnosis not present

## 2018-04-22 DIAGNOSIS — B351 Tinea unguium: Secondary | ICD-10-CM | POA: Diagnosis not present

## 2018-04-22 DIAGNOSIS — E1140 Type 2 diabetes mellitus with diabetic neuropathy, unspecified: Secondary | ICD-10-CM | POA: Diagnosis not present

## 2018-04-22 DIAGNOSIS — M79672 Pain in left foot: Secondary | ICD-10-CM | POA: Diagnosis not present

## 2018-04-22 DIAGNOSIS — M79671 Pain in right foot: Secondary | ICD-10-CM | POA: Diagnosis not present

## 2018-05-04 ENCOUNTER — Other Ambulatory Visit: Payer: Self-pay | Admitting: "Endocrinology

## 2018-05-04 ENCOUNTER — Telehealth: Payer: Self-pay | Admitting: "Endocrinology

## 2018-05-04 MED ORDER — INSULIN ASPART PROT & ASPART (70-30 MIX) 100 UNIT/ML PEN: 30.0000 [IU] | PEN_INJECTOR | Freq: Two times a day (BID) | SUBCUTANEOUS | 0 refills | Status: DC

## 2018-05-04 NOTE — Telephone Encounter (Signed)
I will send a Rx for 1 box. He needs to keep his appointment with labs.

## 2018-05-04 NOTE — Telephone Encounter (Signed)
Deontaye is calling stating that he is out of NOVOLOG MIX 70/30 FLEXPEN (70-30) 100 UNIT/ML FlexPen he missed his last appointment in July he is r/s to Nov 8th with only one pen left, please advise? Home #  [phone]

## 2018-05-11 ENCOUNTER — Ambulatory Visit (INDEPENDENT_AMBULATORY_CARE_PROVIDER_SITE_OTHER): Payer: BLUE CROSS/BLUE SHIELD | Admitting: Otolaryngology

## 2018-05-12 ENCOUNTER — Other Ambulatory Visit: Payer: Self-pay | Admitting: "Endocrinology

## 2018-05-18 DIAGNOSIS — R3915 Urgency of urination: Secondary | ICD-10-CM | POA: Diagnosis not present

## 2018-05-18 DIAGNOSIS — N5201 Erectile dysfunction due to arterial insufficiency: Secondary | ICD-10-CM | POA: Diagnosis not present

## 2018-05-18 DIAGNOSIS — N401 Enlarged prostate with lower urinary tract symptoms: Secondary | ICD-10-CM | POA: Diagnosis not present

## 2018-05-18 DIAGNOSIS — R972 Elevated prostate specific antigen [PSA]: Secondary | ICD-10-CM | POA: Diagnosis not present

## 2018-05-27 ENCOUNTER — Other Ambulatory Visit: Payer: Self-pay | Admitting: "Endocrinology

## 2018-05-27 DIAGNOSIS — E1165 Type 2 diabetes mellitus with hyperglycemia: Secondary | ICD-10-CM | POA: Diagnosis not present

## 2018-05-27 DIAGNOSIS — E785 Hyperlipidemia, unspecified: Secondary | ICD-10-CM

## 2018-05-28 LAB — COMPREHENSIVE METABOLIC PANEL
AG Ratio: 2.1 (calc) (ref 1.0–2.5)
ALT: 15 U/L (ref 9–46)
AST: 14 U/L (ref 10–35)
Albumin: 4.1 g/dL (ref 3.6–5.1)
Alkaline phosphatase (APISO): 62 U/L (ref 40–115)
BUN: 16 mg/dL (ref 7–25)
CHLORIDE: 108 mmol/L (ref 98–110)
CO2: 25 mmol/L (ref 20–32)
CREATININE: 0.95 mg/dL (ref 0.70–1.25)
Calcium: 8.9 mg/dL (ref 8.6–10.3)
GLOBULIN: 2 g/dL (ref 1.9–3.7)
GLUCOSE: 139 mg/dL — AB (ref 65–99)
Potassium: 3.9 mmol/L (ref 3.5–5.3)
SODIUM: 141 mmol/L (ref 135–146)
Total Bilirubin: 0.5 mg/dL (ref 0.2–1.2)
Total Protein: 6.1 g/dL (ref 6.1–8.1)

## 2018-05-28 LAB — HEMOGLOBIN A1C
Hgb A1c MFr Bld: 10.3 % of total Hgb — ABNORMAL HIGH (ref <5.7)
Mean Plasma Glucose: 249 (calc)
eAG (mmol/L): 13.8 (calc)

## 2018-05-28 LAB — LIPID PANEL
CHOL/HDL RATIO: 4.5 (calc) (ref <5.0)
Cholesterol: 176 mg/dL (ref <200)
HDL: 39 mg/dL — AB (ref >40)
LDL CHOLESTEROL (CALC): 122 mg/dL — AB
NON-HDL CHOLESTEROL (CALC): 137 mg/dL — AB (ref <130)
TRIGLYCERIDES: 59 mg/dL (ref <150)

## 2018-05-29 ENCOUNTER — Encounter: Payer: Self-pay | Admitting: "Endocrinology

## 2018-05-29 ENCOUNTER — Ambulatory Visit (INDEPENDENT_AMBULATORY_CARE_PROVIDER_SITE_OTHER): Payer: BLUE CROSS/BLUE SHIELD | Admitting: "Endocrinology

## 2018-05-29 VITALS — BP 138/64 | HR 87 | Ht 71.0 in | Wt 243.0 lb

## 2018-05-29 DIAGNOSIS — IMO0002 Reserved for concepts with insufficient information to code with codable children: Secondary | ICD-10-CM

## 2018-05-29 DIAGNOSIS — E118 Type 2 diabetes mellitus with unspecified complications: Secondary | ICD-10-CM | POA: Diagnosis not present

## 2018-05-29 DIAGNOSIS — E1165 Type 2 diabetes mellitus with hyperglycemia: Secondary | ICD-10-CM

## 2018-05-29 DIAGNOSIS — I10 Essential (primary) hypertension: Secondary | ICD-10-CM | POA: Diagnosis not present

## 2018-05-29 DIAGNOSIS — E782 Mixed hyperlipidemia: Secondary | ICD-10-CM | POA: Diagnosis not present

## 2018-05-29 DIAGNOSIS — Z794 Long term (current) use of insulin: Secondary | ICD-10-CM

## 2018-05-29 MED ORDER — INSULIN ASPART PROT & ASPART (70-30 MIX) 100 UNIT/ML PEN: 35.0000 [IU] | PEN_INJECTOR | Freq: Two times a day (BID) | SUBCUTANEOUS | 0 refills | Status: DC

## 2018-05-29 MED ORDER — ATORVASTATIN CALCIUM 40 MG PO TABS: 40.0000 mg | ORAL_TABLET | Freq: Every day | ORAL | 3 refills | Status: AC

## 2018-05-29 NOTE — Progress Notes (Signed)
Endocrinology follow-up note  Subjective:    Patient ID: Benjamin Flynn, male    DOB: 1951/04/02,    Past Medical History:  Diagnosis Date  . Arthritis   . Asthma   . Diabetes ([place])   . GERD (gastroesophageal reflux disease)   . HTN (hypertension)    Past Surgical History:  Procedure Laterality Date  . COLONOSCOPY  2002   Dr. Gala Romney. Normal.  . COLONOSCOPY WITH ESOPHAGOGASTRODUODENOSCOPY (EGD) N/A 02/10/2013   FMB:[id number] hiatal hernia. Minimally abnormal-appearing gastric mucosa-status post biopsy. PATH H. PYLORI GASTRITIS:Colonic polyp-TUBULAR ADENOMA   Social History   Socioeconomic History  . Marital status: Married    Spouse name: Not on file  . Number of children: 2  . Years of education: Not on file  . Highest education level: Not on file  Occupational History    Employer: [employer]  Social Needs  . Financial resource strain: Not on file  . Food insecurity:    Worry: Not on file    Inability: Not on file  . Transportation needs:    Medical: Not on file    Non-medical: Not on file  Tobacco Use  . Smoking status: Never Smoker  . Smokeless tobacco: Never Used  . Tobacco comment: Never smoked  Substance and Sexual Activity  . Alcohol use: No    Alcohol/week: 0.0 standard drinks    Comment: former user  . Drug use: No  . Sexual activity: Yes    Birth control/protection: None  Lifestyle  . Physical activity:    Days per week: Not on file    Minutes per session: Not on file  . Stress: Not on file  Relationships  . Social connections:    Talks on phone: Not on file    Gets together: Not on file    Attends religious service: Not on file    Active member of club or organization: Not on file    Attends meetings of clubs or organizations: Not on file    Relationship status: Not on file  Other Topics Concern  . Not on file  Social History Narrative  . Not on file   Outpatient Encounter Medications as of 05/29/2018  Medication Sig  . amLODipine  (NORVASC) 5 MG tablet Take 5 mg by mouth daily.  Marland Kitchen atorvastatin (LIPITOR) 40 MG tablet Take 1 tablet (40 mg total) by mouth daily.  Marland Kitchen CARAFATE 1 GM/10ML suspension TAKE  10 ML BY MOUTH 4 TIMES DAILY AS NEEDED  . esomeprazole (NEXIUM) 40 MG capsule TAKE ONE CAPSULE BY MOUTH IN THE MORNING  . glucose blood (ONE TOUCH ULTRA TEST) test strip USE TO CHECK BLOOD SUGAR TWICE DAILY. E11.65  . insulin aspart protamine - aspart (NOVOLOG MIX 70/30 FLEXPEN) (70-30) 100 UNIT/ML FlexPen Inject 0.35 mLs (35 Units total) into the skin 2 (two) times daily before a meal.  . losartan (COZAAR) 25 MG tablet Take 25 mg by mouth daily. Patient unsure of dose.  . metFORMIN (GLUCOPHAGE) 1000 MG tablet Take 1 tablet (1,000 mg total) by mouth 2 (two) times daily with a meal.  . NOVOFINE 30G X 8 MM MISC USE TWICE DAILY  . RABEprazole (ACIPHEX) 20 MG tablet Take 1 tablet (20 mg total) by mouth daily. 30 minutes before breakfast.  . tadalafil (CIALIS) 5 MG tablet Take 5 mg by mouth daily as needed for erectile dysfunction.  Marland Kitchen ULTICARE SHORT PEN NEEDLES 31G X 8 MM MISC USE TO INJECT INSULIN TWICE DAILY  . [DISCONTINUED] atorvastatin (LIPITOR)  20 MG tablet Take 1 tablet (20 mg total) by mouth daily. (Patient not taking: Reported on 05/29/2018)  . [DISCONTINUED] insulin aspart protamine - aspart (NOVOLOG MIX 70/30 FLEXPEN) (70-30) 100 UNIT/ML FlexPen Inject 0.3 mLs (30 Units total) into the skin 2 (two) times daily.   No facility-administered encounter medications on file as of 05/29/2018.    ALLERGIES: No Known Allergies  Diabetes  He presents for his follow-up diabetic visit. He has type 2 diabetes mellitus. His disease course has been worsening. There are no hypoglycemic associated symptoms. Pertinent negatives for hypoglycemia include no confusion, headaches, pallor or seizures. Associated symptoms include polydipsia and polyuria. Pertinent negatives for diabetes include no chest pain, no fatigue, no polyphagia and no  weakness. There are no hypoglycemic complications. Symptoms are worsening. There are no diabetic complications. Risk factors for coronary artery disease include dyslipidemia, diabetes mellitus, hypertension, male sex, obesity and sedentary lifestyle. He is compliant with treatment none of the time. His weight is fluctuating minimally. He is following a generally unhealthy diet. He has not had a previous visit with a dietitian. He never participates in exercise. There is no compliance with monitoring of blood glucose. His overall blood glucose range is >200 mg/dl. (After missing his appointment since April 2019, returns with a meter showing only rare and random monitoring averaging 213.  His A1c is 10.3%. ) An ACE inhibitor/angiotensin II receptor blocker is being taken.  Hypertension  This is a chronic problem. The current episode started more than 1 year ago. Pertinent negatives include no chest pain, headaches, neck pain, palpitations or shortness of breath. Past treatments include alpha 1 blockers.  Hyperlipidemia  This is a chronic problem. The current episode started more than 1 year ago. Pertinent negatives include no chest pain, myalgias or shortness of breath.     Review of Systems  Constitutional: Negative for fatigue and unexpected weight change.  HENT: Negative for dental problem, mouth sores and trouble swallowing.   Eyes: Negative for visual disturbance.  Respiratory: Negative for cough, choking, chest tightness, shortness of breath and wheezing.   Cardiovascular: Negative for chest pain, palpitations and leg swelling.  Gastrointestinal: Negative for abdominal distention, abdominal pain, constipation, diarrhea, nausea and vomiting.  Endocrine: Positive for polydipsia and polyuria. Negative for polyphagia.  Genitourinary: Negative for dysuria, flank pain, hematuria and urgency.  Musculoskeletal: Negative for back pain, gait problem, myalgias and neck pain.  Skin: Negative for pallor,  rash and wound.  Neurological: Negative for seizures, syncope, weakness, numbness and headaches.  Psychiatric/Behavioral: Negative.  Negative for confusion and dysphoric mood.    Objective:    BP 138/64   Pulse 87   Ht 5\' 11"  (1.803 m)   Wt 243 lb (110.2 kg)   BMI 33.89 kg/m   Wt Readings from Last 3 Encounters:  05/29/18 243 lb (110.2 kg)  10/20/17 245 lb (111.1 kg)  05/12/17 240 lb (108.9 kg)    Physical Exam  Constitutional: He is oriented to person, place, and time. He appears well-developed and well-nourished. He is cooperative. No distress.  HENT:  Head: Normocephalic and atraumatic.  Eyes: EOM are normal.  Neck: Normal range of motion. Neck supple. No tracheal deviation present. No thyromegaly present.  Cardiovascular: Normal rate, S1 normal, S2 normal and normal heart sounds. Exam reveals no gallop.  No murmur heard. Pulses:      Dorsalis pedis pulses are 1+ on the right side, and 1+ on the left side.       Posterior tibial pulses  are 1+ on the right side, and 1+ on the left side.  Pulmonary/Chest: Breath sounds normal. No respiratory distress. He has no wheezes.  Abdominal: Soft. Bowel sounds are normal. He exhibits no distension. There is no tenderness. There is no guarding and no CVA tenderness.  Musculoskeletal: He exhibits no edema.       Right shoulder: He exhibits no swelling and no deformity.  Neurological: He is alert and oriented to person, place, and time. He has normal strength and normal reflexes. No cranial nerve deficit or sensory deficit. Gait normal.  Skin: Skin is warm and dry. No rash noted. No cyanosis. Nails show no clubbing.  Psychiatric: He has a normal mood and affect. His speech is normal. Judgment normal. Cognition and memory are normal.  Reluctant and noncompliant affect.    Results for orders placed or performed in visit on 05/27/18  Comprehensive metabolic panel  Result Value Ref Range   Glucose, Bld 139 (H) 65 - 99 mg/dL   BUN 16 7 - 25  mg/dL   Creat 0.95 0.70 - 1.25 mg/dL   BUN/Creatinine Ratio NOT APPLICABLE 6 - 22 (calc)   Sodium 141 135 - 146 mmol/L   Potassium 3.9 3.5 - 5.3 mmol/L   Chloride 108 98 - 110 mmol/L   CO2 25 20 - 32 mmol/L   Calcium 8.9 8.6 - 10.3 mg/dL   Total Protein 6.1 6.1 - 8.1 g/dL   Albumin 4.1 3.6 - 5.1 g/dL   Globulin 2.0 1.9 - 3.7 g/dL (calc)   AG Ratio 2.1 1.0 - 2.5 (calc)   Total Bilirubin 0.5 0.2 - 1.2 mg/dL   Alkaline phosphatase (APISO) 62 40 - 115 U/L   AST 14 10 - 35 U/L   ALT 15 9 - 46 U/L  Hemoglobin A1c  Result Value Ref Range   Hgb A1c MFr Bld 10.3 (H) <5.7 % of total Hgb   Mean Plasma Glucose 249 (calc)   eAG (mmol/L) 13.8 (calc)  Lipid Panel  Result Value Ref Range   Cholesterol 176 <200 mg/dL   HDL 39 (L) >40 mg/dL   Triglycerides 59 <150 mg/dL   LDL Cholesterol (Calc) 122 (H) mg/dL (calc)   Total CHOL/HDL Ratio 4.5 <5.0 (calc)   Non-HDL Cholesterol (Calc) 137 (H) <130 mg/dL (calc)   Complete Blood Count (Most recent): Lab Results  Component Value Date   WBC 3.8 (L) 03/24/2015   HGB 13.5 03/24/2015   HCT 40.2 03/24/2015   MCV 86.6 03/24/2015   PLT 223 03/24/2015   Chemistry (most recent): Lab Results  Component Value Date   NA 141 05/27/2018   K 3.9 05/27/2018   CL 108 05/27/2018   CO2 25 05/27/2018   BUN 16 05/27/2018   CREATININE 0.95 05/27/2018   Diabetic Labs (most recent): Lab Results  Component Value Date   HGBA1C 10.3 (H) 05/27/2018   HGBA1C 10.0 (H) 10/17/2017   HGBA1C 10.0 (H) 01/27/2017   Lipid Panel     Component Value Date/Time   CHOL 176 05/27/2018 1014   TRIG 59 05/27/2018 1014   HDL 39 (L) 05/27/2018 1014   CHOLHDL 4.5 05/27/2018 1014   VLDL 11 01/27/2017 1119   LDLCALC 122 (H) 05/27/2018 1014     Assessment & Plan:   1. Uncontrolled type 2 diabetes mellitus without complication, with long-term current use of insulin ([place])  After missing his appointment since April 2019, returns with higher A1c of 10.3%.  He monitors only  rarely and randomly average  blood glucose of 213 , and did not bring any insulin administration records.      -His diabetes is complicated by an alarming noncompliance/nonadherence, he remains at extremely high risk for more acute and chronic complications of diabetes which include CAD, CVA, CKD, retinopathy, and neuropathy. These are all discussed in detail with the patient.  - I have re-counseled the patient on diet management and  Weight loss  by adopting a carbohydrate restricted / protein rich  Diet.  - Patient is advised to stick to a routine mealtimes to eat 3 meals  a day and avoid unnecessary snacks ( to snack only to correct hypoglycemia).  -  Suggestion is made for him to avoid simple carbohydrates  from his diet including Cakes, Sweet Desserts / Pastries, Ice Cream, Soda (diet and regular), Sweet Tea, Candies, Chips, Cookies, Store Bought Juices, Alcohol in Excess of  1-2 drinks a day, Artificial Sweeteners, and "Sugar-free" Products. This will help patient to have stable blood glucose profile and potentially avoid unintended weight gain.  - I have approached patient with the following individualized plan to manage diabetes and patient agrees.  -This patient is once again displaying an alarming noncompliance/nonadherence.   -He did not stick to the plan given to him, and missed his appointment since April 2019.   -Further given glycemic burden that he has with A1c of 10.3%, he would need intensive treatment with basal/bolus insulin to achieve control of diabetes.  However, he was too overwhelmed to execute 4 injections of insulin daily from prior attempt.    -He is approached to continue his simplified insulin regimen utilizing NovoLog 70/30, increased to 35 units with breakfast and 35 units with supper for premeal readings of >90, associated with strict monitoring of blood glucose 4 times a day-daily before breakfast, lunch, dinner, and at bedtime and return in 4 weeks for reevaluation.      - Patient is warned not to take insulin without proper monitoring per orders.  -Patient is encouraged to call clinic for blood glucose levels less than 70 or above 200 mg /dl.  -He is advised to continue metformin 1000 mg p.o. twice daily- after breakfast and after supper, therapeutically suitable for patient.   - Patient specific target  for A1c; LDL, HDL, Triglycerides, and  Waist Circumference were discussed in detail.  2) BP/HTN: His blood pressure is controlled to target.  He is advised to continue his current blood pressure medications including amlodipine 5 mg p.o. daily, losartan 25 mg p.o. daily.    3) HPL-his recent lipid panel showed uncontrolled LDL at 122.  He is urged to resume his a statin therapy.  I discussed and increased his atorvastatin to 40 mg p.o. Nightly.  Side effects and precautions discussed with him.     4)  Weight/Diet: CDE consult in progress, exercise, and carbohydrates information provided.  5) Chronic Care/Health Maintenance:  -Patient ARB  and encouraged to continue to follow up with Ophthalmology, Podiatrist at least yearly or according to recommendations, and advised to stay away from smoking. I have recommended yearly flu vaccine and pneumonia vaccination at least every 5 years; moderate intensity exercise for up to 150 minutes weekly; and  sleep for at least 7 hours a day.  I advised patient to maintain close follow up with his PCP for primary care needs.  - Time spent with the patient: 25 min, of which >50% was spent in reviewing his blood glucose logs , discussing his hypo- and hyper-glycemic episodes, reviewing  his current and  previous labs and insulin doses and developing a plan to avoid hypo- and hyper-glycemia. Please refer to Patient Instructions for Blood Glucose Monitoring and Insulin/Medications Dosing Guide"  in media tab for additional information. Benjamin Flynn participated in the discussions, expressed understanding, and voiced agreement  with the above plans.  All questions were answered to his satisfaction. he is encouraged to contact clinic should he have any questions or concerns prior to his return visit.  Follow up plan: Return in about 4 weeks (around 06/26/2018) for Follow up with Meter and Logs Only - no Labs.  Glade Lloyd, MD Phone: [phone]  Fax: [phone]  -  This note was partially dictated with voice recognition software. Similar sounding words can be transcribed inadequately or may not  be corrected upon review.  05/29/2018, 1:16 PM

## 2018-05-29 NOTE — Patient Instructions (Signed)

## 2018-06-16 DIAGNOSIS — I10 Essential (primary) hypertension: Secondary | ICD-10-CM | POA: Diagnosis not present

## 2018-06-16 DIAGNOSIS — C61 Malignant neoplasm of prostate: Secondary | ICD-10-CM | POA: Diagnosis not present

## 2018-06-16 DIAGNOSIS — E1165 Type 2 diabetes mellitus with hyperglycemia: Secondary | ICD-10-CM | POA: Diagnosis not present

## 2018-06-16 DIAGNOSIS — Z0001 Encounter for general adult medical examination with abnormal findings: Secondary | ICD-10-CM | POA: Diagnosis not present

## 2018-06-16 DIAGNOSIS — E785 Hyperlipidemia, unspecified: Secondary | ICD-10-CM | POA: Diagnosis not present

## 2018-06-22 DIAGNOSIS — Z23 Encounter for immunization: Secondary | ICD-10-CM | POA: Diagnosis not present

## 2018-07-02 ENCOUNTER — Ambulatory Visit: Payer: BLUE CROSS/BLUE SHIELD | Admitting: "Endocrinology

## 2018-07-20 DIAGNOSIS — Z23 Encounter for immunization: Secondary | ICD-10-CM | POA: Diagnosis not present

## 2018-07-20 LAB — HM DIABETES EYE EXAM

## 2018-07-21 ENCOUNTER — Other Ambulatory Visit: Payer: Self-pay | Admitting: "Endocrinology

## 2018-08-06 ENCOUNTER — Ambulatory Visit (INDEPENDENT_AMBULATORY_CARE_PROVIDER_SITE_OTHER): Payer: BLUE CROSS/BLUE SHIELD | Admitting: "Endocrinology

## 2018-08-06 ENCOUNTER — Encounter: Payer: Self-pay | Admitting: "Endocrinology

## 2018-08-06 VITALS — BP 123/75 | HR 86 | Ht 71.0 in | Wt 240.0 lb

## 2018-08-06 DIAGNOSIS — E118 Type 2 diabetes mellitus with unspecified complications: Secondary | ICD-10-CM | POA: Diagnosis not present

## 2018-08-06 DIAGNOSIS — I10 Essential (primary) hypertension: Secondary | ICD-10-CM | POA: Diagnosis not present

## 2018-08-06 DIAGNOSIS — IMO0002 Reserved for concepts with insufficient information to code with codable children: Secondary | ICD-10-CM

## 2018-08-06 DIAGNOSIS — E782 Mixed hyperlipidemia: Secondary | ICD-10-CM | POA: Diagnosis not present

## 2018-08-06 DIAGNOSIS — E1165 Type 2 diabetes mellitus with hyperglycemia: Secondary | ICD-10-CM

## 2018-08-06 DIAGNOSIS — Z794 Long term (current) use of insulin: Secondary | ICD-10-CM

## 2018-08-06 NOTE — Patient Instructions (Signed)

## 2018-08-06 NOTE — Progress Notes (Signed)
Endocrinology follow-up note  Subjective:    Patient ID: Benjamin Flynn, male    DOB: September 29, 1950,    Past Medical History:  Diagnosis Date  . Arthritis   . Asthma   . Diabetes ([place])   . GERD (gastroesophageal reflux disease)   . HTN (hypertension)    Past Surgical History:  Procedure Laterality Date  . COLONOSCOPY  2002   Dr. Gala Romney. Normal.  . COLONOSCOPY WITH ESOPHAGOGASTRODUODENOSCOPY (EGD) N/A 02/10/2013   IRS:WNIOE hiatal hernia. Minimally abnormal-appearing gastric mucosa-status post biopsy. PATH H. PYLORI GASTRITIS:Colonic polyp-TUBULAR ADENOMA   Social History   Socioeconomic History  . Marital status: Married    Spouse name: Not on file  . Number of children: 2  . Years of education: Not on file  . Highest education level: Not on file  Occupational History    Employer: [employer]  Social Needs  . Financial resource strain: Not on file  . Food insecurity:    Worry: Not on file    Inability: Not on file  . Transportation needs:    Medical: Not on file    Non-medical: Not on file  Tobacco Use  . Smoking status: Never Smoker  . Smokeless tobacco: Never Used  . Tobacco comment: Never smoked  Substance and Sexual Activity  . Alcohol use: No    Alcohol/week: 0.0 standard drinks    Comment: former user  . Drug use: No  . Sexual activity: Yes    Birth control/protection: None  Lifestyle  . Physical activity:    Days per week: Not on file    Minutes per session: Not on file  . Stress: Not on file  Relationships  . Social connections:    Talks on phone: Not on file    Gets together: Not on file    Attends religious service: Not on file    Active member of club or organization: Not on file    Attends meetings of clubs or organizations: Not on file    Relationship status: Not on file  Other Topics Concern  . Not on file  Social History Narrative  . Not on file   Outpatient Encounter Medications as of 08/06/2018  Medication Sig  . amLODipine  (NORVASC) 5 MG tablet Take 5 mg by mouth daily.  Marland Kitchen atorvastatin (LIPITOR) 40 MG tablet Take 1 tablet (40 mg total) by mouth daily.  Marland Kitchen CARAFATE 1 GM/10ML suspension TAKE  10 ML BY MOUTH 4 TIMES DAILY AS NEEDED  . esomeprazole (NEXIUM) 40 MG capsule TAKE ONE CAPSULE BY MOUTH IN THE MORNING  . glucose blood test strip USE TO CHECK BLOOD SUGAR TWICE DAILY.  Marland Kitchen insulin aspart protamine - aspart (NOVOLOG MIX 70/30 FLEXPEN) (70-30) 100 UNIT/ML FlexPen Inject 0.35 mLs (35 Units total) into the skin 2 (two) times daily before a meal.  . losartan (COZAAR) 25 MG tablet Take 25 mg by mouth daily. Patient unsure of dose.  . metFORMIN (GLUCOPHAGE) 1000 MG tablet Take 1 tablet (1,000 mg total) by mouth 2 (two) times daily with a meal.  . NOVOFINE 30G X 8 MM MISC USE TWICE DAILY  . RABEprazole (ACIPHEX) 20 MG tablet Take 1 tablet (20 mg total) by mouth daily. 30 minutes before breakfast.  . tadalafil (CIALIS) 5 MG tablet Take 5 mg by mouth daily as needed for erectile dysfunction.  Marland Kitchen ULTICARE SHORT PEN NEEDLES 31G X 8 MM MISC USE TO INJECT INSULIN TWICE DAILY   No facility-administered encounter medications on file as of  08/06/2018.    ALLERGIES: No Known Allergies  Diabetes  He presents for his follow-up diabetic visit. He has type 2 diabetes mellitus. His disease course has been worsening. There are no hypoglycemic associated symptoms. Pertinent negatives for hypoglycemia include no confusion, headaches, pallor or seizures. Associated symptoms include polydipsia and polyuria. Pertinent negatives for diabetes include no chest pain, no fatigue, no polyphagia and no weakness. There are no hypoglycemic complications. Symptoms are worsening. There are no diabetic complications. Risk factors for coronary artery disease include dyslipidemia, diabetes mellitus, hypertension, male sex, obesity and sedentary lifestyle. He is compliant with treatment none of the time. His weight is fluctuating minimally. He is following a  generally unhealthy diet. He has not had a previous visit with a dietitian. He never participates in exercise. There is no compliance with monitoring of blood glucose. His overall blood glucose range is >200 mg/dl. (He missed his last appointment.  He presents with a meter showing only one reading in the last 7 days.  His average blood glucose is 337.  His most recent A1c was 10.3%.  Patient remains noncompliant.    ) An ACE inhibitor/angiotensin II receptor blocker is being taken.  Hypertension  This is a chronic problem. The current episode started more than 1 year ago. Pertinent negatives include no chest pain, headaches, neck pain, palpitations or shortness of breath. Past treatments include alpha 1 blockers.  Hyperlipidemia  This is a chronic problem. The current episode started more than 1 year ago. Pertinent negatives include no chest pain, myalgias or shortness of breath.    Review of Systems  Constitutional: Negative for fatigue and unexpected weight change.  HENT: Negative for dental problem, mouth sores and trouble swallowing.   Eyes: Negative for visual disturbance.  Respiratory: Negative for cough, choking, chest tightness, shortness of breath and wheezing.   Cardiovascular: Negative for chest pain, palpitations and leg swelling.  Gastrointestinal: Negative for abdominal distention, abdominal pain, constipation, diarrhea, nausea and vomiting.  Endocrine: Positive for polydipsia and polyuria. Negative for polyphagia.  Genitourinary: Negative for dysuria, flank pain, hematuria and urgency.  Musculoskeletal: Negative for back pain, gait problem, myalgias and neck pain.  Skin: Negative for pallor, rash and wound.  Neurological: Negative for seizures, syncope, weakness, numbness and headaches.  Psychiatric/Behavioral: Negative.  Negative for confusion and dysphoric mood.    Objective:    BP 123/75   Pulse 86   Ht 5\' 11"  (1.803 m)   Wt 240 lb (108.9 kg)   BMI 33.47 kg/m   Wt  Readings from Last 3 Encounters:  08/06/18 240 lb (108.9 kg)  05/29/18 243 lb (110.2 kg)  10/20/17 245 lb (111.1 kg)    Physical Exam  Constitutional: He is oriented to person, place, and time. He appears well-developed and well-nourished. He is cooperative. No distress.  HENT:  Head: Normocephalic and atraumatic.  Eyes: EOM are normal.  Neck: Normal range of motion. Neck supple. No tracheal deviation present. No thyromegaly present.  Cardiovascular: Normal rate, S1 normal, S2 normal and normal heart sounds. Exam reveals no gallop.  No murmur heard. Pulses:      Dorsalis pedis pulses are 1+ on the right side and 1+ on the left side.       Posterior tibial pulses are 1+ on the right side and 1+ on the left side.  Pulmonary/Chest: Breath sounds normal. No respiratory distress. He has no wheezes.  Abdominal: Soft. Bowel sounds are normal. He exhibits no distension. There is no abdominal tenderness. There  is no guarding and no CVA tenderness.  Musculoskeletal:        General: No edema.     Right shoulder: He exhibits no swelling and no deformity.  Neurological: He is alert and oriented to person, place, and time. He has normal strength and normal reflexes. No cranial nerve deficit or sensory deficit. Gait normal.  Skin: Skin is warm and dry. No rash noted. No cyanosis. Nails show no clubbing.  Psychiatric: He has a normal mood and affect. His speech is normal. Judgment normal. Cognition and memory are normal.  Reluctant and noncompliant affect.    Results for orders placed or performed in visit on 05/27/18  Comprehensive metabolic panel  Result Value Ref Range   Glucose, Bld 139 (H) 65 - 99 mg/dL   BUN 16 7 - 25 mg/dL   Creat 0.95 0.70 - 1.25 mg/dL   BUN/Creatinine Ratio NOT APPLICABLE 6 - 22 (calc)   Sodium 141 135 - 146 mmol/L   Potassium 3.9 3.5 - 5.3 mmol/L   Chloride 108 98 - 110 mmol/L   CO2 25 20 - 32 mmol/L   Calcium 8.9 8.6 - 10.3 mg/dL   Total Protein 6.1 6.1 - 8.1 g/dL    Albumin 4.1 3.6 - 5.1 g/dL   Globulin 2.0 1.9 - 3.7 g/dL (calc)   AG Ratio 2.1 1.0 - 2.5 (calc)   Total Bilirubin 0.5 0.2 - 1.2 mg/dL   Alkaline phosphatase (APISO) 62 40 - 115 U/L   AST 14 10 - 35 U/L   ALT 15 9 - 46 U/L  Hemoglobin A1c  Result Value Ref Range   Hgb A1c MFr Bld 10.3 (H) <5.7 % of total Hgb   Mean Plasma Glucose 249 (calc)   eAG (mmol/L) 13.8 (calc)  Lipid Panel  Result Value Ref Range   Cholesterol 176 <200 mg/dL   HDL 39 (L) >40 mg/dL   Triglycerides 59 <150 mg/dL   LDL Cholesterol (Calc) 122 (H) mg/dL (calc)   Total CHOL/HDL Ratio 4.5 <5.0 (calc)   Non-HDL Cholesterol (Calc) 137 (H) <130 mg/dL (calc)   Complete Blood Count (Most recent): Lab Results  Component Value Date   WBC 3.8 (L) 03/24/2015   HGB 13.5 03/24/2015   HCT 40.2 03/24/2015   MCV 86.6 03/24/2015   PLT 223 03/24/2015   Chemistry (most recent): Lab Results  Component Value Date   NA 141 05/27/2018   K 3.9 05/27/2018   CL 108 05/27/2018   CO2 25 05/27/2018   BUN 16 05/27/2018   CREATININE 0.95 05/27/2018   Diabetic Labs (most recent): Lab Results  Component Value Date   HGBA1C 10.3 (H) 05/27/2018   HGBA1C 10.0 (H) 10/17/2017   HGBA1C 10.0 (H) 01/27/2017   Lipid Panel     Component Value Date/Time   CHOL 176 05/27/2018 1014   TRIG 59 05/27/2018 1014   HDL 39 (L) 05/27/2018 1014   CHOLHDL 4.5 05/27/2018 1014   VLDL 11 01/27/2017 1119   LDLCALC 122 (H) 05/27/2018 1014     Assessment & Plan:   1. Uncontrolled type 2 diabetes mellitus without complication, with long-term current use of insulin ([place])  -He missed his last appointment.  He presents with inadequate monitoring and remains alarmingly noncompliant.   Recent A1c was 10.3%.      -His diabetes is complicated by an chronic alarming noncompliance/nonadherence, he remains at extremely high risk for more acute and chronic complications of diabetes which include CAD, CVA, CKD, retinopathy, and neuropathy. These  are all  discussed in detail with the patient.  - I have re-counseled the patient on diet management and  Weight loss  by adopting a carbohydrate restricted / protein rich  Diet.  - Patient is advised to stick to a routine mealtimes to eat 3 meals  a day and avoid unnecessary snacks ( to snack only to correct hypoglycemia).  -  Suggestion is made for him to avoid simple carbohydrates  from his diet including Cakes, Sweet Desserts / Pastries, Ice Cream, Soda (diet and regular), Sweet Tea, Candies, Chips, Cookies, Store Bought Juices, Alcohol in Excess of  1-2 drinks a day, Artificial Sweeteners, and "Sugar-free" Products. This will help patient to have stable blood glucose profile and potentially avoid unintended weight gain.   - I have approached patient with the following individualized plan to manage diabetes and patient agrees.  -This patient remains unengaged.      -Even his current and chronic glycemic burden, he would require intensive treatment with basal/bolus insulin in order for him to achieve and maintain control of diabetes to target.  However he did not comply even to twice a day injection of premixed insulin.    -He misses to do better.  He is  approached to start  his simplified insulin regimen utilizing NovoLog 70/30,  35 units with breakfast and 35 units with supper for premeal readings of >90, associated with strict monitoring of blood glucose 4 times a day-daily before breakfast, lunch, dinner, and at bedtime and return in 5 weeks for reevaluation.     - Patient is warned not to take insulin without proper monitoring per orders.  -Patient is encouraged to call clinic for blood glucose levels less than 70 or above 200 mg /dl.  -He is advised to continue metformin 1000 mg p.o. twice daily- after breakfast and after supper, therapeutically suitable for patient.   - Patient specific target  for A1c; LDL, HDL, Triglycerides, and  Waist Circumference were discussed in detail.  2)  BP/HTN: His blood pressure is controlled to target.  He is advised to continue his current blood pressure medications including amlodipine 5 mg p.o. daily, losartan 25 mg p.o. daily.    3) HPL-his recent lipid panel showed uncontrolled LDL at 122.  He is urged to resume his a statin therapy.  I discussed and increased his atorvastatin to 40 mg p.o. Nightly.  Side effects and precautions discussed with him.     4)  Weight/Diet: CDE consult in progress, exercise, and carbohydrates information provided.  5) Chronic Care/Health Maintenance:  -Patient ARB  and encouraged to continue to follow up with Ophthalmology, Podiatrist at least yearly or according to recommendations, and advised to stay away from smoking. I have recommended yearly flu vaccine and pneumonia vaccination at least every 5 years; moderate intensity exercise for up to 150 minutes weekly; and  sleep for at least 7 hours a day.  I advised patient to maintain close follow up with his PCP for primary care needs. - Time spent with the patient: 25 min, of which >50% was spent in reviewing his  current and  previous labs, previous treatments, and medications doses and developing a plan for long-term care.  Benjamin Flynn participated in the discussions, expressed understanding, and voiced agreement with the above plans.  All questions were answered to his satisfaction. he is encouraged to contact clinic should he have any questions or concerns prior to his return visit.   Follow up plan: Return in about 5 weeks (  around 09/10/2018) for Follow up with Pre-visit Labs, Meter, and Logs.  Glade Lloyd, MD Phone: [phone]  Fax: [phone]  -  This note was partially dictated with voice recognition software. Similar sounding words can be transcribed inadequately or may not  be corrected upon review.  08/06/2018, 1:23 PM

## 2018-09-17 ENCOUNTER — Ambulatory Visit: Payer: BLUE CROSS/BLUE SHIELD | Admitting: "Endocrinology

## 2018-09-30 ENCOUNTER — Ambulatory Visit: Payer: BLUE CROSS/BLUE SHIELD | Admitting: "Endocrinology

## 2018-10-08 DIAGNOSIS — E118 Type 2 diabetes mellitus with unspecified complications: Secondary | ICD-10-CM | POA: Diagnosis not present

## 2018-10-08 DIAGNOSIS — E1165 Type 2 diabetes mellitus with hyperglycemia: Secondary | ICD-10-CM | POA: Diagnosis not present

## 2018-10-08 DIAGNOSIS — Z794 Long term (current) use of insulin: Secondary | ICD-10-CM | POA: Diagnosis not present

## 2018-10-09 LAB — COMPLETE METABOLIC PANEL WITH GFR
AG RATIO: 1.9 (calc) (ref 1.0–2.5)
ALT: 12 U/L (ref 9–46)
AST: 12 U/L (ref 10–35)
Albumin: 4 g/dL (ref 3.6–5.1)
Alkaline phosphatase (APISO): 66 U/L (ref 35–144)
BUN: 21 mg/dL (ref 7–25)
CALCIUM: 9.1 mg/dL (ref 8.6–10.3)
CO2: 26 mmol/L (ref 20–32)
CREATININE: 1.07 mg/dL (ref 0.70–1.25)
Chloride: 107 mmol/L (ref 98–110)
GFR, EST AFRICAN AMERICAN: 82 mL/min/{1.73_m2} (ref >=60)
GFR, EST NON AFRICAN AMERICAN: 71 mL/min/{1.73_m2} (ref >=60)
Globulin: 2.1 g/dL (calc) (ref 1.9–3.7)
Glucose, Bld: 146 mg/dL — ABNORMAL HIGH (ref 65–139)
Potassium: 3.8 mmol/L (ref 3.5–5.3)
Sodium: 140 mmol/L (ref 135–146)
TOTAL PROTEIN: 6.1 g/dL (ref 6.1–8.1)
Total Bilirubin: 0.5 mg/dL (ref 0.2–1.2)

## 2018-10-09 LAB — HEMOGLOBIN A1C
Hgb A1c MFr Bld: 10.4 % of total Hgb — ABNORMAL HIGH (ref <5.7)
Mean Plasma Glucose: 252 (calc)
eAG (mmol/L): 13.9 (calc)

## 2018-10-13 ENCOUNTER — Telehealth (INDEPENDENT_AMBULATORY_CARE_PROVIDER_SITE_OTHER): Payer: BLUE CROSS/BLUE SHIELD | Admitting: "Endocrinology

## 2018-10-13 DIAGNOSIS — IMO0002 Reserved for concepts with insufficient information to code with codable children: Secondary | ICD-10-CM

## 2018-10-13 DIAGNOSIS — Z794 Long term (current) use of insulin: Secondary | ICD-10-CM | POA: Diagnosis not present

## 2018-10-13 DIAGNOSIS — E1165 Type 2 diabetes mellitus with hyperglycemia: Secondary | ICD-10-CM | POA: Diagnosis not present

## 2018-10-13 DIAGNOSIS — E782 Mixed hyperlipidemia: Secondary | ICD-10-CM

## 2018-10-13 DIAGNOSIS — E118 Type 2 diabetes mellitus with unspecified complications: Secondary | ICD-10-CM

## 2018-10-13 MED ORDER — INSULIN ASPART PROT & ASPART (70-30 MIX) 100 UNIT/ML PEN: 35.0000 [IU] | PEN_INJECTOR | Freq: Two times a day (BID) | SUBCUTANEOUS | 2 refills | Status: DC

## 2018-10-13 NOTE — Patient Instructions (Signed)
Endocrinology Telephone Visit Follow up Note -During COVID -19 Pandemic  Subjective:    Patient ID: Benjamin Flynn, male    DOB: November 13, 1950,    Past Medical History:  Diagnosis Date  . Arthritis   . Asthma   . Diabetes ([place])   . GERD (gastroesophageal reflux disease)   . HTN (hypertension)    Past Surgical History:  Procedure Laterality Date  . COLONOSCOPY  2002   Dr. Gala Romney. Normal.  . COLONOSCOPY WITH ESOPHAGOGASTRODUODENOSCOPY (EGD) N/A 02/10/2013   [id number] hiatal hernia. Minimally abnormal-appearing gastric mucosa-status post biopsy. PATH H. PYLORI GASTRITIS:Colonic polyp-TUBULAR ADENOMA   Social History   Socioeconomic History  . Marital status: Married    Spouse name: Not on file  . Number of children: 2  . Years of education: Not on file  . Highest education level: Not on file  Occupational History    Employer: [employer]  Social Needs  . Financial resource strain: Not on file  . Food insecurity:    Worry: Not on file    Inability: Not on file  . Transportation needs:    Medical: Not on file    Non-medical: Not on file  Tobacco Use  . Smoking status: Never Smoker  . Smokeless tobacco: Never Used  . Tobacco comment: Never smoked  Substance and Sexual Activity  . Alcohol use: No    Alcohol/week: 0.0 standard drinks    Comment: former user  . Drug use: No  . Sexual activity: Yes    Birth control/protection: None  Lifestyle  . Physical activity:    Days per week: Not on file    Minutes per session: Not on file  . Stress: Not on file  Relationships  . Social connections:    Talks on phone: Not on file    Gets together: Not on file    Attends religious service: Not on file    Active member of club or organization: Not on file    Attends meetings of clubs or organizations: Not on file    Relationship status: Not on file  Other Topics Concern  . Not on file  Social History Narrative  . Not on  file   Outpatient Encounter Medications as of 10/13/2018  Medication Sig  . amLODipine (NORVASC) 5 MG tablet Take 5 mg by mouth daily.  Marland Kitchen atorvastatin (LIPITOR) 40 MG tablet Take 1 tablet (40 mg total) by mouth daily.  Marland Kitchen CARAFATE 1 GM/10ML suspension TAKE  10 ML BY MOUTH 4 TIMES DAILY AS NEEDED  . esomeprazole (NEXIUM) 40 MG capsule TAKE ONE CAPSULE BY MOUTH IN THE MORNING  . glucose blood test strip USE TO CHECK BLOOD SUGAR TWICE DAILY.  Marland Kitchen insulin aspart protamine - aspart (NOVOLOG MIX 70/30 FLEXPEN) (70-30) 100 UNIT/ML FlexPen Inject 0.35 mLs (35 Units total) into the skin 2 (two) times daily before a meal.  . losartan (COZAAR) 25 MG tablet Take 25 mg by mouth daily. Patient unsure of dose.  . metFORMIN (GLUCOPHAGE) 1000 MG tablet Take 1 tablet (1,000 mg total) by mouth 2 (two) times daily with a meal.  . NOVOFINE 30G X 8 MM MISC USE TWICE DAILY  . RABEprazole (ACIPHEX) 20 MG tablet  Take 1 tablet (20 mg total) by mouth daily. 30 minutes before breakfast.  . tadalafil (CIALIS) 5 MG tablet Take 5 mg by mouth daily as needed for erectile dysfunction.  Marland Kitchen ULTICARE SHORT PEN NEEDLES 31G X 8 MM MISC USE TO INJECT INSULIN TWICE DAILY  . [DISCONTINUED] insulin aspart protamine - aspart (NOVOLOG MIX 70/30 FLEXPEN) (70-30) 100 UNIT/ML FlexPen Inject 0.35 mLs (35 Units total) into the skin 2 (two) times daily before a meal.   No facility-administered encounter medications on file as of 10/13/2018.    ALLERGIES: No Known Allergies  Diabetes  He presents for his follow-up diabetic visit. He has type 2 diabetes mellitus. His disease course has been worsening. His previsit labs show unchanged A1c at 10.4%.  He is known to have chronic nonadherence/noncompliance.  He reports that his fasting blood glucose ranges from 75-240, and postprandial blood glucose ranges from 130-240.  He denies hypoglycemic episodes.  Risk factors for coronary artery disease include dyslipidemia, diabetes mellitus, hypertension,  male sex, obesity and sedentary lifestyle.    He is following a generally unhealthy diet. He has not had a previous visit with a dietitian. He never participates in exercise. There is no compliance with monitoring of blood glucose. ) An ACE inhibitor/angiotensin II receptor blocker is being taken.   Hyperlipidemia  This is a chronic problem. The current episode started more than 1 year ago. Pertinent negatives include no chest pain, myalgias or shortness of breath.    Objective:       Results for orders placed or performed in visit on 08/06/18  Hemoglobin A1c  Result Value Ref Range   Hgb A1c MFr Bld 10.4 (H) <5.7 % of total Hgb   Mean Plasma Glucose 252 (calc)   eAG (mmol/L) 13.9 (calc)  COMPLETE METABOLIC PANEL WITH GFR  Result Value Ref Range   Glucose, Bld 146 (H) 65 - 139 mg/dL   BUN 21 7 - 25 mg/dL   Creat 1.07 0.70 - 1.25 mg/dL   GFR, Est Non African American 71 > OR = 60 mL/min/1.76m2   GFR, Est African American 82 > OR = 60 mL/min/1.26m2   BUN/Creatinine Ratio NOT APPLICABLE 6 - 22 (calc)   Sodium 140 135 - 146 mmol/L   Potassium 3.8 3.5 - 5.3 mmol/L   Chloride 107 98 - 110 mmol/L   CO2 26 20 - 32 mmol/L   Calcium 9.1 8.6 - 10.3 mg/dL   Total Protein 6.1 6.1 - 8.1 g/dL   Albumin 4.0 3.6 - 5.1 g/dL   Globulin 2.1 1.9 - 3.7 g/dL (calc)   AG Ratio 1.9 1.0 - 2.5 (calc)   Total Bilirubin 0.5 0.2 - 1.2 mg/dL   Alkaline phosphatase (APISO) 66 35 - 144 U/L   AST 12 10 - 35 U/L   ALT 12 9 - 46 U/L   Complete Blood Count (Most recent): Lab Results  Component Value Date   WBC 3.8 (L) 03/24/2015   HGB 13.5 03/24/2015   HCT 40.2 03/24/2015   MCV 86.6 03/24/2015   PLT 223 03/24/2015   Chemistry (most recent): Lab Results  Component Value Date   NA 140 10/08/2018   K 3.8 10/08/2018   CL 107 10/08/2018   CO2 26 10/08/2018   BUN 21 10/08/2018   CREATININE 1.07 10/08/2018   Diabetic Labs (most recent): Lab Results  Component Value Date   HGBA1C 10.4 (H)  10/08/2018   HGBA1C 10.3 (H) 05/27/2018   HGBA1C 10.0 (H) 10/17/2017  Lipid Panel     Component Value Date/Time   CHOL 176 05/27/2018 1014   TRIG 59 05/27/2018 1014   HDL 39 (L) 05/27/2018 1014   CHOLHDL 4.5 05/27/2018 1014   VLDL 11 01/27/2017 1119   LDLCALC 122 (H) 05/27/2018 1014     Assessment & Plan:   1. Uncontrolled type 2 diabetes mellitus without complication, with long-term current use of insulin ([place]) -This is a telephone visit due to the coronavirus pandemic. -His previsit labs show A1c of 10.4% unchanged from his previous visits.    -His diabetes is complicated by an chronic alarming noncompliance/nonadherence, he remains at extremely high risk for more acute and chronic complications of diabetes which include CAD, CVA, CKD, retinopathy, and neuropathy. These are all discussed in detail with the patient.  - Patient admits there is a room for improvement in his diet and drink choices. -  Suggestion is made for him to avoid simple carbohydrates  from his diet including Cakes, Sweet Desserts / Pastries, Ice Cream, Soda (diet and regular), Sweet Tea, Candies, Chips, Cookies, Store Bought Juices, Alcohol in Excess of  1-2 drinks a day, Artificial Sweeteners, and "Sugar-free" Products. This will help patient to have stable blood glucose profile and potentially avoid unintended weight gain.  -There has not been much change in his chronic noncompliance/nonadherence.  He is advised to restart strict monitoring of blood glucose at least 2 times a day-before breakfast and before supper and at any other time as needed. -He is advised to continue NovoLog 70/30 35 units with breakfast and 35 units with supper for premeal readings of >90, associated with strict monitoring of blood glucose 4 times a day-daily before breakfast, lunch, dinner, and at bedtime and return in 3 months with labs, meter and logs.  - Patient is warned not to take insulin without proper monitoring per  orders. -Patient is encouraged to call clinic for blood glucose levels less than 70 or above 200 mg /dl.  -He is advised to continue metformin 1000 mg p.o. twice daily - after breakfast and after supper, therapeutically suitable for patient.  2) HPL-his recent lipid panel showed uncontrolled LDL at 122.  He is urged to resume his a statin therapy.  He is advised to continue atorvastatin 40 mg p.o. nightly .  Side effects and precautions discussed with him.     - Patient Care Time Today:  25 min, of which >50% was spent in reviewing his  current and  previous labs/studies, previous treatments, and medications doses and developing a plan for long-term care based on the latest recommendations for standards of care.  Benjamin Flynn participated in the discussions, expressed understanding, and voiced agreement with the above plans.  All questions were answered to his satisfaction. he is encouraged to contact clinic should he have any questions or concerns prior to his return visit.   Follow up plan: -Return in 3 months with labs, meter/log. Glade Lloyd, MD Phone: [phone]  Fax: [phone]  -  This note was partially dictated with voice recognition software. Similar sounding words can be transcribed inadequately or may not  be corrected upon review.  10/13/2018, 11:32 AM

## 2018-10-13 NOTE — Progress Notes (Signed)
Endocrinology Telephone Visit Follow up Note -During COVID -19 Pandemic  Subjective:    Patient ID: Benjamin Flynn, male    DOB: 09/02/1950,    Past Medical History:  Diagnosis Date  . Arthritis   . Asthma   . Diabetes ([place])   . GERD (gastroesophageal reflux disease)   . HTN (hypertension)    Past Surgical History:  Procedure Laterality Date  . COLONOSCOPY  2002   Dr. Gala Romney. Normal.  . COLONOSCOPY WITH ESOPHAGOGASTRODUODENOSCOPY (EGD) N/A 02/10/2013   [id number] hiatal hernia. Minimally abnormal-appearing gastric mucosa-status post biopsy. PATH H. PYLORI GASTRITIS:Colonic polyp-TUBULAR ADENOMA   Social History   Socioeconomic History  . Marital status: Married    Spouse name: Not on file  . Number of children: 2  . Years of education: Not on file  . Highest education level: Not on file  Occupational History    Employer: [employer]  Social Needs  . Financial resource strain: Not on file  . Food insecurity:    Worry: Not on file    Inability: Not on file  . Transportation needs:    Medical: Not on file    Non-medical: Not on file  Tobacco Use  . Smoking status: Never Smoker  . Smokeless tobacco: Never Used  . Tobacco comment: Never smoked  Substance and Sexual Activity  . Alcohol use: No    Alcohol/week: 0.0 standard drinks    Comment: former user  . Drug use: No  . Sexual activity: Yes    Birth control/protection: None  Lifestyle  . Physical activity:    Days per week: Not on file    Minutes per session: Not on file  . Stress: Not on file  Relationships  . Social connections:    Talks on phone: Not on file    Gets together: Not on file    Attends religious service: Not on file    Active member of club or organization: Not on file    Attends meetings of clubs or organizations: Not on file    Relationship status: Not on file  Other Topics Concern  . Not on file  Social History Narrative  . Not on  file   Outpatient Encounter Medications as of 10/13/2018  Medication Sig  . amLODipine (NORVASC) 5 MG tablet Take 5 mg by mouth daily.  Marland Kitchen atorvastatin (LIPITOR) 40 MG tablet Take 1 tablet (40 mg total) by mouth daily.  Marland Kitchen CARAFATE 1 GM/10ML suspension TAKE  10 ML BY MOUTH 4 TIMES DAILY AS NEEDED  . esomeprazole (NEXIUM) 40 MG capsule TAKE ONE CAPSULE BY MOUTH IN THE MORNING  . glucose blood test strip USE TO CHECK BLOOD SUGAR TWICE DAILY.  Marland Kitchen insulin aspart protamine - aspart (NOVOLOG MIX 70/30 FLEXPEN) (70-30) 100 UNIT/ML FlexPen Inject 0.35 mLs (35 Units total) into the skin 2 (two) times daily before a meal.  . losartan (COZAAR) 25 MG tablet Take 25 mg by mouth daily. Patient unsure of dose.  . metFORMIN (GLUCOPHAGE) 1000 MG tablet Take 1 tablet (1,000 mg total) by mouth 2 (two) times daily with a meal.  . NOVOFINE 30G X 8 MM MISC USE TWICE DAILY  . RABEprazole (ACIPHEX) 20 MG tablet Take 1 tablet (20 mg total) by mouth daily. 30 minutes before breakfast.  . tadalafil (CIALIS) 5 MG tablet Take 5 mg by mouth daily as needed for erectile dysfunction.  Marland Kitchen ULTICARE SHORT PEN NEEDLES 31G X 8 MM MISC USE TO INJECT INSULIN TWICE DAILY  . [DISCONTINUED]  insulin aspart protamine - aspart (NOVOLOG MIX 70/30 FLEXPEN) (70-30) 100 UNIT/ML FlexPen Inject 0.35 mLs (35 Units total) into the skin 2 (two) times daily before a meal.   No facility-administered encounter medications on file as of 10/13/2018.    ALLERGIES: No Known Allergies  Diabetes  He presents for his follow-up diabetic visit. He has type 2 diabetes mellitus. His disease course has been worsening. His previsit labs show unchanged A1c at 10.4%.  He is known to have chronic nonadherence/noncompliance.  He reports that his fasting blood glucose ranges from 75-240, and postprandial blood glucose ranges from 130-240.  He denies hypoglycemic episodes.  Risk factors for coronary artery disease include dyslipidemia, diabetes mellitus, hypertension,  male sex, obesity and sedentary lifestyle.    He is following a generally unhealthy diet. He has not had a previous visit with a dietitian. He never participates in exercise. There is no compliance with monitoring of blood glucose. ) An ACE inhibitor/angiotensin II receptor blocker is being taken.   Hyperlipidemia  This is a chronic problem. The current episode started more than 1 year ago. Pertinent negatives include no chest pain, myalgias or shortness of breath.    Objective:       Results for orders placed or performed in visit on 08/06/18  Hemoglobin A1c  Result Value Ref Range   Hgb A1c MFr Bld 10.4 (H) <5.7 % of total Hgb   Mean Plasma Glucose 252 (calc)   eAG (mmol/L) 13.9 (calc)  COMPLETE METABOLIC PANEL WITH GFR  Result Value Ref Range   Glucose, Bld 146 (H) 65 - 139 mg/dL   BUN 21 7 - 25 mg/dL   Creat 1.07 0.70 - 1.25 mg/dL   GFR, Est Non African American 71 > OR = 60 mL/min/1.48m2   GFR, Est African American 82 > OR = 60 mL/min/1.51m2   BUN/Creatinine Ratio NOT APPLICABLE 6 - 22 (calc)   Sodium 140 135 - 146 mmol/L   Potassium 3.8 3.5 - 5.3 mmol/L   Chloride 107 98 - 110 mmol/L   CO2 26 20 - 32 mmol/L   Calcium 9.1 8.6 - 10.3 mg/dL   Total Protein 6.1 6.1 - 8.1 g/dL   Albumin 4.0 3.6 - 5.1 g/dL   Globulin 2.1 1.9 - 3.7 g/dL (calc)   AG Ratio 1.9 1.0 - 2.5 (calc)   Total Bilirubin 0.5 0.2 - 1.2 mg/dL   Alkaline phosphatase (APISO) 66 35 - 144 U/L   AST 12 10 - 35 U/L   ALT 12 9 - 46 U/L   Complete Blood Count (Most recent): Lab Results  Component Value Date   WBC 3.8 (L) 03/24/2015   HGB 13.5 03/24/2015   HCT 40.2 03/24/2015   MCV 86.6 03/24/2015   PLT 223 03/24/2015   Chemistry (most recent): Lab Results  Component Value Date   NA 140 10/08/2018   K 3.8 10/08/2018   CL 107 10/08/2018   CO2 26 10/08/2018   BUN 21 10/08/2018   CREATININE 1.07 10/08/2018   Diabetic Labs (most recent): Lab Results  Component Value Date   HGBA1C 10.4 (H)  10/08/2018   HGBA1C 10.3 (H) 05/27/2018   HGBA1C 10.0 (H) 10/17/2017   Lipid Panel     Component Value Date/Time   CHOL 176 05/27/2018 1014   TRIG 59 05/27/2018 1014   HDL 39 (L) 05/27/2018 1014   CHOLHDL 4.5 05/27/2018 1014   VLDL 11 01/27/2017 1119   LDLCALC 122 (H) 05/27/2018 1014  Assessment & Plan:   1. Uncontrolled type 2 diabetes mellitus without complication, with long-term current use of insulin ([place]) -This is a telephone visit due to the coronavirus pandemic. -His previsit labs show A1c of 10.4% unchanged from his previous visits.    -His diabetes is complicated by an chronic alarming noncompliance/nonadherence, he remains at extremely high risk for more acute and chronic complications of diabetes which include CAD, CVA, CKD, retinopathy, and neuropathy. These are all discussed in detail with the patient.  - Patient admits there is a room for improvement in his diet and drink choices. -  Suggestion is made for him to avoid simple carbohydrates  from his diet including Cakes, Sweet Desserts / Pastries, Ice Cream, Soda (diet and regular), Sweet Tea, Candies, Chips, Cookies, Store Bought Juices, Alcohol in Excess of  1-2 drinks a day, Artificial Sweeteners, and "Sugar-free" Products. This will help patient to have stable blood glucose profile and potentially avoid unintended weight gain.  -There has not been much change in his chronic noncompliance/nonadherence.  He is advised to restart strict monitoring of blood glucose at least 2 times a day-before breakfast and before supper and at any other time as needed. -He is advised to continue NovoLog 70/30 35 units with breakfast and 35 units with supper for premeal readings of >90, associated with strict monitoring of blood glucose 4 times a day-daily before breakfast, lunch, dinner, and at bedtime and return in 3 months with labs, meter and logs.  - Patient is warned not to take insulin without proper monitoring per  orders. -Patient is encouraged to call clinic for blood glucose levels less than 70 or above 200 mg /dl.  -He is advised to continue metformin 1000 mg p.o. twice daily - after breakfast and after supper, therapeutically suitable for patient.  2) HPL-his recent lipid panel showed uncontrolled LDL at 122.  He is urged to resume his a statin therapy.  He is advised to continue atorvastatin 40 mg p.o. nightly .  Side effects and precautions discussed with him.     - Patient Care Time Today:  25 min, of which >50% was spent in reviewing his  current and  previous labs/studies, previous treatments, and medications doses and developing a plan for long-term care based on the latest recommendations for standards of care.  Benjamin Flynn participated in the discussions, expressed understanding, and voiced agreement with the above plans.  All questions were answered to his satisfaction. he is encouraged to contact clinic should he have any questions or concerns prior to his return visit.   Follow up plan: -Return in 3 months with labs, meter/log. Glade Lloyd, MD Phone: [phone]  Fax: [phone]  -  This note was partially dictated with voice recognition software. Similar sounding words can be transcribed inadequately or may not  be corrected upon review.  10/13/2018, 11:32 AM

## 2018-11-09 ENCOUNTER — Other Ambulatory Visit: Payer: Self-pay | Admitting: "Endocrinology

## 2018-12-09 ENCOUNTER — Other Ambulatory Visit: Payer: Self-pay | Admitting: "Endocrinology

## 2019-01-08 ENCOUNTER — Other Ambulatory Visit: Payer: BLUE CROSS/BLUE SHIELD

## 2019-01-08 ENCOUNTER — Other Ambulatory Visit: Payer: Self-pay | Admitting: Internal Medicine

## 2019-01-08 DIAGNOSIS — R6889 Other general symptoms and signs: Secondary | ICD-10-CM | POA: Diagnosis not present

## 2019-01-08 DIAGNOSIS — Z20822 Contact with and (suspected) exposure to covid-19: Secondary | ICD-10-CM

## 2019-01-10 LAB — NOVEL CORONAVIRUS, NAA: SARS-CoV-2, NAA: NOT DETECTED

## 2019-01-13 ENCOUNTER — Ambulatory Visit: Payer: BLUE CROSS/BLUE SHIELD | Admitting: "Endocrinology

## 2019-02-10 ENCOUNTER — Ambulatory Visit: Payer: Self-pay | Admitting: "Endocrinology

## 2019-02-24 ENCOUNTER — Ambulatory Visit: Payer: Self-pay | Admitting: "Endocrinology

## 2019-03-01 ENCOUNTER — Ambulatory Visit: Payer: Self-pay | Admitting: "Endocrinology

## 2019-03-01 ENCOUNTER — Encounter: Payer: Self-pay | Admitting: "Endocrinology

## 2019-03-12 DIAGNOSIS — E1165 Type 2 diabetes mellitus with hyperglycemia: Secondary | ICD-10-CM | POA: Diagnosis not present

## 2019-03-12 DIAGNOSIS — I10 Essential (primary) hypertension: Secondary | ICD-10-CM | POA: Diagnosis not present

## 2019-03-12 DIAGNOSIS — E118 Type 2 diabetes mellitus with unspecified complications: Secondary | ICD-10-CM | POA: Diagnosis not present

## 2019-03-12 DIAGNOSIS — Z794 Long term (current) use of insulin: Secondary | ICD-10-CM | POA: Diagnosis not present

## 2019-03-12 DIAGNOSIS — R609 Edema, unspecified: Secondary | ICD-10-CM | POA: Diagnosis not present

## 2019-03-13 LAB — COMPLETE METABOLIC PANEL WITH GFR
AG Ratio: 2 (calc) (ref 1.0–2.5)
ALT: 14 U/L (ref 9–46)
AST: 13 U/L (ref 10–35)
Albumin: 4.1 g/dL (ref 3.6–5.1)
Alkaline phosphatase (APISO): 65 U/L (ref 35–144)
BUN: 23 mg/dL (ref 7–25)
CO2: 24 mmol/L (ref 20–32)
Calcium: 9.2 mg/dL (ref 8.6–10.3)
Chloride: 103 mmol/L (ref 98–110)
Creat: 0.99 mg/dL (ref 0.70–1.25)
GFR, Est African American: 90 mL/min/{1.73_m2} (ref >=60)
GFR, Est Non African American: 77 mL/min/{1.73_m2} (ref >=60)
Globulin: 2.1 g/dL (calc) (ref 1.9–3.7)
Glucose, Bld: 218 mg/dL — ABNORMAL HIGH (ref 65–139)
Potassium: 3.6 mmol/L (ref 3.5–5.3)
Sodium: 137 mmol/L (ref 135–146)
Total Bilirubin: 0.9 mg/dL (ref 0.2–1.2)
Total Protein: 6.2 g/dL (ref 6.1–8.1)

## 2019-03-13 LAB — HEMOGLOBIN A1C
Hgb A1c MFr Bld: 10.1 % of total Hgb — ABNORMAL HIGH (ref <5.7)
Mean Plasma Glucose: 243 (calc)
eAG (mmol/L): 13.5 (calc)

## 2019-03-18 ENCOUNTER — Other Ambulatory Visit: Payer: Self-pay | Admitting: "Endocrinology

## 2019-03-22 ENCOUNTER — Other Ambulatory Visit: Payer: Self-pay | Admitting: "Endocrinology

## 2019-03-22 MED ORDER — NOVOLOG MIX 70/30 FLEXPEN (70-30) 100 UNIT/ML ~~LOC~~ SUPN: PEN_INJECTOR | SUBCUTANEOUS | 0 refills | Status: DC

## 2019-03-22 NOTE — Telephone Encounter (Signed)
Last visit 10/13/2018

## 2019-04-05 DIAGNOSIS — N402 Nodular prostate without lower urinary tract symptoms: Secondary | ICD-10-CM | POA: Diagnosis not present

## 2019-04-05 DIAGNOSIS — E1165 Type 2 diabetes mellitus with hyperglycemia: Secondary | ICD-10-CM | POA: Diagnosis not present

## 2019-04-05 DIAGNOSIS — I10 Essential (primary) hypertension: Secondary | ICD-10-CM | POA: Diagnosis not present

## 2019-04-14 DIAGNOSIS — M79672 Pain in left foot: Secondary | ICD-10-CM | POA: Diagnosis not present

## 2019-04-14 DIAGNOSIS — E1140 Type 2 diabetes mellitus with diabetic neuropathy, unspecified: Secondary | ICD-10-CM | POA: Diagnosis not present

## 2019-04-14 DIAGNOSIS — L110 Acquired keratosis follicularis: Secondary | ICD-10-CM | POA: Diagnosis not present

## 2019-04-14 DIAGNOSIS — M79671 Pain in right foot: Secondary | ICD-10-CM | POA: Diagnosis not present

## 2019-05-12 DIAGNOSIS — N401 Enlarged prostate with lower urinary tract symptoms: Secondary | ICD-10-CM | POA: Diagnosis not present

## 2019-05-20 DIAGNOSIS — R972 Elevated prostate specific antigen [PSA]: Secondary | ICD-10-CM | POA: Diagnosis not present

## 2019-05-20 DIAGNOSIS — N5201 Erectile dysfunction due to arterial insufficiency: Secondary | ICD-10-CM | POA: Diagnosis not present

## 2019-05-20 DIAGNOSIS — R350 Frequency of micturition: Secondary | ICD-10-CM | POA: Diagnosis not present

## 2019-05-20 DIAGNOSIS — N401 Enlarged prostate with lower urinary tract symptoms: Secondary | ICD-10-CM | POA: Diagnosis not present

## 2019-06-13 ENCOUNTER — Other Ambulatory Visit: Payer: Self-pay | Admitting: "Endocrinology

## 2019-06-21 ENCOUNTER — Telehealth: Payer: Self-pay | Admitting: "Endocrinology

## 2019-06-21 ENCOUNTER — Other Ambulatory Visit: Payer: Self-pay | Admitting: "Endocrinology

## 2019-06-21 MED ORDER — NOVOLOG MIX 70/30 FLEXPEN (70-30) 100 UNIT/ML ~~LOC~~ SUPN: PEN_INJECTOR | SUBCUTANEOUS | 0 refills | Status: DC

## 2019-06-21 NOTE — Telephone Encounter (Signed)
Pt needs refill on insulin aspart protamine - aspart (NOVOLOG MIX 70/30 FLEXPEN) (70-30) 100 UNIT/ML FlexPen. [facility] [place], he is scheduled for phone visit Wednesday 12/2

## 2019-06-21 NOTE — Telephone Encounter (Signed)
Done

## 2019-06-23 ENCOUNTER — Ambulatory Visit (INDEPENDENT_AMBULATORY_CARE_PROVIDER_SITE_OTHER): Payer: BC Managed Care – PPO | Admitting: "Endocrinology

## 2019-06-23 ENCOUNTER — Encounter: Payer: Self-pay | Admitting: "Endocrinology

## 2019-06-23 ENCOUNTER — Other Ambulatory Visit: Payer: Self-pay

## 2019-06-23 DIAGNOSIS — Z794 Long term (current) use of insulin: Secondary | ICD-10-CM

## 2019-06-23 DIAGNOSIS — E118 Type 2 diabetes mellitus with unspecified complications: Secondary | ICD-10-CM

## 2019-06-23 DIAGNOSIS — I10 Essential (primary) hypertension: Secondary | ICD-10-CM | POA: Diagnosis not present

## 2019-06-23 DIAGNOSIS — IMO0002 Reserved for concepts with insufficient information to code with codable children: Secondary | ICD-10-CM

## 2019-06-23 DIAGNOSIS — E1165 Type 2 diabetes mellitus with hyperglycemia: Secondary | ICD-10-CM

## 2019-06-23 DIAGNOSIS — E782 Mixed hyperlipidemia: Secondary | ICD-10-CM

## 2019-06-23 MED ORDER — GLIPIZIDE ER 5 MG PO TB24: 5.0000 mg | ORAL_TABLET | Freq: Every day | ORAL | 3 refills | Status: AC

## 2019-06-23 NOTE — Progress Notes (Signed)
06/23/2019                                                    Endocrinology Telehealth Visit Follow up Note -During COVID -19 Pandemic  This visit type was conducted due to national recommendations for restrictions regarding the COVID-19 Pandemic  in an effort to limit this patient's exposure and mitigate transmission of the corona virus.  Due to his co-morbid illnesses, Benjamin Flynn is at  moderate to high risk for complications without adequate follow up.  This format is felt to be most appropriate for him at this time.  I connected with this patient on 06/23/2019   by telephone and verified that I am speaking with the correct person using two identifiers. Benjamin Flynn, 03-Apr-1951. he has verbally consented to this visit. All issues noted in this document were discussed and addressed. The format was not optimal for physical exam.    Subjective:    Patient ID: Benjamin Flynn, male    DOB: April 21, 1951,   Past Medical History:  Diagnosis Date  . Arthritis   . Asthma   . Diabetes ([place])   . GERD (gastroesophageal reflux disease)   . HTN (hypertension)    Past Surgical History:  Procedure Laterality Date  . COLONOSCOPY  2002   Dr. Gala Flynn. Normal.  . COLONOSCOPY WITH ESOPHAGOGASTRODUODENOSCOPY (EGD) N/A 02/10/2013   [id number] hiatal hernia. Minimally abnormal-appearing gastric mucosa-status post biopsy. PATH H. PYLORI GASTRITIS:Colonic polyp-TUBULAR ADENOMA     Social History   Relationships  Social connections  . Talks on phone: Not on file  . Gets together: Not on file  . Attends religious service: Not on file  . Active member of club or organization: Not on file  . Attends meetings of clubs or organizations: Not on file  . Relationship status: Not on file    Socioeconomic History  . Marital status: Married    Spouse name: Not on file  . Number of children: 2  . Years of education: Not on file  . Highest education level: Not on file  Occupational History    Employer: [employer]  Social Needs  . Financial resource strain: Not on file  . Food insecurity:    Worry: Not on file    Inability: Not on file  . Transportation needs:    Medical: Not on file    Non-medical: Not on file  Tobacco Use  . Smoking status: Never Smoker  . Smokeless tobacco: Never Used  . Tobacco comment: Never smoked  Substance and Sexual Activity  . Alcohol use: No    Alcohol/week: 0.0 standard drinks    Comment: former user  . Drug use: No  . Sexual activity: Yes    Birth control/protection: None  Lifestyle  . Physical activity:    Days per week: Not on file    Minutes per session: Not on file  . Stress: Not on file  Relationships  . Social connections:    Talks on phone: Not on file    Gets together: Not on file    Attends religious service: Not on file    Active member of club or organization: Not on file    Attends meetings of clubs or organizations: Not on file    Relationship status: Not on file  Other Topics Concern  . Not on  file  Social History Narrative  . Not on file   Current Outpatient Medications on File Prior to Visit  Medication Sig Dispense Refill  . amLODipine (NORVASC) 5 MG tablet Take 5 mg by mouth daily.    Marland Kitchen atorvastatin (LIPITOR) 40 MG tablet Take 1 tablet (40 mg total) by mouth daily. 30 tablet 3  . CARAFATE 1 GM/10ML suspension TAKE  10 ML BY MOUTH 4 TIMES DAILY AS NEEDED 420 mL 3  . esomeprazole (NEXIUM) 40 MG capsule TAKE ONE CAPSULE BY MOUTH IN THE MORNING 90 capsule 3  . glucose blood (ONE TOUCH ULTRA TEST) test strip USE 1 STRIP TO CHECK GLUCOSE 4 x DAILY 150 each 5  . insulin aspart protamine - aspart (NOVOLOG MIX 70/30 FLEXPEN) (70-30) 100 UNIT/ML FlexPen INJECT 35 UNITS SUBCUTANEOUSLY TWICE DAILY BEFORE A MEAL 5 pen 0  . losartan (COZAAR) 25 MG tablet Take 25 mg by mouth daily. Patient unsure of dose.    . metFORMIN (GLUCOPHAGE) 1000 MG tablet Take 1 tablet (1,000 mg total) by mouth 2 (two) times daily with a meal. 60 tablet 2  .  NOVOFINE 30G X 8 MM MISC USE TWICE DAILY 100 each 5  . RABEprazole (ACIPHEX) 20 MG tablet Take 1 tablet (20 mg total) by mouth daily. 30 minutes before breakfast. 30 tablet 5  . tadalafil (CIALIS) 5 MG tablet Take 5 mg by mouth daily as needed for erectile dysfunction.    Marland Kitchen ULTICARE SHORT PEN NEEDLES 31G X 8 MM MISC USE TO INJECT INSULIN TWICE DAILY 100 each 5   No current facility-administered medications on file prior to visit.     No facility-administered encounter medications on file as of 10/13/2018.    ALLERGIES: No Known Allergies  Diabetes  He presents for his follow-up diabetic visit. He has type 2 diabetes mellitus. His disease course has been worsening. His previsit labs show A1c is still high at 10.1%.  He is known to have chronic nonadherence/noncompliance.  He missed his appointment since January 2020.  He reports recent blood glucose readings from 66-186 at fasting, 4 56-300 supper.    Risk factors for coronary artery disease include dyslipidemia, diabetes mellitus, hypertension, male sex, obesity and sedentary lifestyle.    He is following a generally unhealthy diet. He has not had a previous visit with a dietitian. He never participates in exercise. There is no compliance with monitoring of blood glucose. ) An ACE inhibitor/angiotensin II receptor blocker is being taken.   Hyperlipidemia  This is a chronic problem. The current episode started more than 1 year ago. Pertinent negatives include no chest pain, myalgias or shortness of breath.    Objective:     CMP Latest Ref Rng & Units 03/12/2019 10/08/2018 05/27/2018  Glucose 65 - 139 mg/dL 218(H) 146(H) 139(H)  BUN 7 - 25 mg/dL 23 21 16   Creatinine 0.70 - 1.25 mg/dL 0.99 1.07 0.95  Sodium 135 - 146 mmol/L 137 140 141  Potassium 3.5 - 5.3 mmol/L 3.6 3.8 3.9  Chloride 98 - 110 mmol/L 103 107 108  CO2 20 - 32 mmol/L 24 26 25   Calcium 8.6 - 10.3 mg/dL 9.2 9.1 8.9  Total Protein 6.1 - 8.1 g/dL 6.2 6.1 6.1  Total Bilirubin  0.2 - 1.2 mg/dL 0.9 0.5 0.5  Alkaline Phos 40 - 115 U/L - - -  AST 10 - 35 U/L 13 12 14   ALT 9 - 46 U/L 14 12 15     Results for Benjamin Flynn, Benjamin Flynn (MRN  [id number]) as of 06/23/2019 11:54  Ref. Range 10/08/2018 10:46 03/12/2019 10:54  eAG (mmol/L) Latest Units: (calc) 13.9 13.5  Glucose Latest Ref Range: 65 - 139 mg/dL 146 (H) 218 (H)  Hemoglobin A1C Latest Ref Range: <5.7 % of total Hgb 10.4 (H) 10.1 (H)   Lipid Panel     Component Value Date/Time   CHOL 176 05/27/2018 1014   TRIG 59 05/27/2018 1014   HDL 39 (L) 05/27/2018 1014   CHOLHDL 4.5 05/27/2018 1014   VLDL 11 01/27/2017 1119   LDLCALC 122 (H) 05/27/2018 1014     Assessment & Plan:   1. Uncontrolled type 2 diabetes mellitus without complication, with long-term current use of insulin ([place]) -This is a telephone visit due to the coronavirus pandemic. -His previsit labs show A1c of 10.1% unchanged from his previous visits.    -His diabetes is complicated by an chronic alarming noncompliance/nonadherence, he remains at extremely high risk for more acute and chronic complications of diabetes which include CAD, CVA, CKD, retinopathy, and neuropathy. These are all discussed in detail with the patient.  - he  admits there is a room for improvement in his diet and drink choices. -  Suggestion is made for him to avoid simple carbohydrates  from his diet including Cakes, Sweet Desserts / Pastries, Ice Cream, Soda (diet and regular), Sweet Tea, Candies, Chips, Cookies, Sweet Pastries,  Store Bought Juices, Alcohol in Excess of  1-2 drinks a day, Artificial Sweeteners, Coffee Creamer, and "Sugar-free" Products. This will help patient to have stable blood glucose profile and potentially avoid unintended weight gain.   -There has not been much change in his chronic noncompliance/nonadherence.  He is advised to restart strict monitoring of blood glucose at least 2 times a day-before breakfast and before supper and at any other time as needed. -He  would need intensive treatment with basal/bolus insulin in order for him to achieve and maintain control of diabetes to target.  However he did not comply with the intensive treatment recommendations. -He is advised to continue  NovoLog 70/30 35 units with breakfast and 35 units with supper for premeal readings of >90, associated with strict monitoring of blood glucose 4 times a day-daily before breakfast, lunch, dinner, and at bedtime and return in 1 week with his meter and logs for reevaluation.   - Patient is warned not to take insulin without proper monitoring per orders. -Patient is encouraged to call clinic for blood glucose levels less than 70 or above 200 mg /dl.  -He is advised to continue metformin 1000 mg p.o. twice daily - after breakfast and after supper, therapeutically suitable for patient. - I Discussed initiated low-dose glipizide, 5 mg p.o. daily at breakfast.  2) HPL-his recent lipid panel showed uncontrolled LDL at 122.  He is urged to resume his a statin therapy.  He is advised to continue atorvastatin 40 mg p.o. nightly.   Side effects and precautions discussed with him.     3) hypertension: he is advised to home monitor blood pressure and report if > 140/90 on 2 separate readings. He is advised to continue amlodipine 5 mg p.o. daily, losartan 25 mg p.o. daily.  Is advised to maintain close follow-up with his PMD, Dr. Legrand Rams.   - Patient Care Time Today:  25 min, of which >50% was spent in  counseling and the rest reviewing his  current and  previous labs/studies, previous treatments, his blood glucose readings, and medications' doses and developing a plan for long-term  care based on the latest recommendations for standards of care.   Benjamin Flynn participated in the discussions, expressed understanding, and voiced agreement with the above plans.  All questions were answered to his satisfaction. he is encouraged to contact clinic should he have any questions or concerns prior  to his return visit.   Follow up plan: -Return in 3 months with labs, meter/log. Glade Lloyd, MD Phone: [phone]  Fax: [phone]  -  This note was partially dictated with voice recognition software. Similar sounding words can be transcribed inadequately or may not  be corrected upon review.  10/13/2018, 11:32 AM

## 2019-07-02 ENCOUNTER — Ambulatory Visit: Payer: BC Managed Care – PPO | Admitting: "Endocrinology

## 2019-07-05 ENCOUNTER — Ambulatory Visit: Payer: BLUE CROSS/BLUE SHIELD | Admitting: "Endocrinology

## 2019-07-12 ENCOUNTER — Telehealth: Payer: Self-pay | Admitting: "Endocrinology

## 2019-07-12 MED ORDER — NOVOLOG MIX 70/30 FLEXPEN (70-30) 100 UNIT/ML ~~LOC~~ SUPN: PEN_INJECTOR | SUBCUTANEOUS | 0 refills | Status: AC

## 2019-07-12 NOTE — Telephone Encounter (Signed)
Pt is requesting a refill on insulin aspart protamine - aspart (NOVOLOG MIX 70/30 FLEXPEN) (70-30) 100 UNIT/ML FlexPen. He is scheduled to come back in 1/8

## 2019-07-12 NOTE — Telephone Encounter (Signed)
Rx sent 

## 2019-07-22 DIAGNOSIS — E1165 Type 2 diabetes mellitus with hyperglycemia: Secondary | ICD-10-CM | POA: Diagnosis not present

## 2019-07-22 DIAGNOSIS — I10 Essential (primary) hypertension: Secondary | ICD-10-CM | POA: Diagnosis not present

## 2019-07-22 DIAGNOSIS — Z0001 Encounter for general adult medical examination with abnormal findings: Secondary | ICD-10-CM | POA: Diagnosis not present

## 2019-07-26 DIAGNOSIS — E669 Obesity, unspecified: Secondary | ICD-10-CM | POA: Diagnosis not present

## 2019-07-26 DIAGNOSIS — Z794 Long term (current) use of insulin: Secondary | ICD-10-CM | POA: Diagnosis not present

## 2019-07-26 DIAGNOSIS — R0902 Hypoxemia: Secondary | ICD-10-CM | POA: Diagnosis not present

## 2019-07-26 DIAGNOSIS — R4020 Unspecified coma: Secondary | ICD-10-CM | POA: Diagnosis not present

## 2019-07-26 DIAGNOSIS — I10 Essential (primary) hypertension: Secondary | ICD-10-CM | POA: Diagnosis not present

## 2019-07-26 DIAGNOSIS — R55 Syncope and collapse: Secondary | ICD-10-CM | POA: Diagnosis not present

## 2019-07-26 DIAGNOSIS — I499 Cardiac arrhythmia, unspecified: Secondary | ICD-10-CM | POA: Diagnosis not present

## 2019-07-26 DIAGNOSIS — U071 COVID-19: Secondary | ICD-10-CM | POA: Diagnosis not present

## 2019-07-26 DIAGNOSIS — E860 Dehydration: Secondary | ICD-10-CM | POA: Diagnosis not present

## 2019-07-26 DIAGNOSIS — N289 Disorder of kidney and ureter, unspecified: Secondary | ICD-10-CM | POA: Diagnosis not present

## 2019-07-26 DIAGNOSIS — E1110 Type 2 diabetes mellitus with ketoacidosis without coma: Secondary | ICD-10-CM | POA: Diagnosis not present

## 2019-07-26 DIAGNOSIS — E1165 Type 2 diabetes mellitus with hyperglycemia: Secondary | ICD-10-CM | POA: Diagnosis not present

## 2019-07-26 DIAGNOSIS — R069 Unspecified abnormalities of breathing: Secondary | ICD-10-CM | POA: Diagnosis not present

## 2019-07-26 DIAGNOSIS — R404 Transient alteration of awareness: Secondary | ICD-10-CM | POA: Diagnosis not present

## 2019-07-27 DIAGNOSIS — R55 Syncope and collapse: Secondary | ICD-10-CM | POA: Diagnosis not present

## 2019-07-30 ENCOUNTER — Ambulatory Visit: Payer: BC Managed Care – PPO | Admitting: "Endocrinology

## 2019-08-23 DIAGNOSIS — 419620001 Death: Secondary | SNOMED CT | POA: Diagnosis not present

## 2019-08-23 DEATH — deceased
